# Patient Record
Sex: Male | Born: 1955 | Hispanic: No | Marital: Married | Smoking: Former smoker
Health system: Southern US, Community
[De-identification: ages and names within clinical notes are randomized; demographics above are authoritative.]

## PROBLEM LIST (undated history)

## (undated) DIAGNOSIS — G43109 Migraine with aura, not intractable, without status migrainosus: Secondary | ICD-10-CM

## (undated) DIAGNOSIS — E785 Hyperlipidemia, unspecified: Secondary | ICD-10-CM

## (undated) HISTORY — DX: Migraine with aura, not intractable, without status migrainosus: G43.109

## (undated) HISTORY — DX: Hyperlipidemia, unspecified: E78.5

---

## 1997-11-02 ENCOUNTER — Other Ambulatory Visit: Admission: RE | Admit: 1997-11-02 | Discharge: 1997-11-02 | Payer: Self-pay | Admitting: Family Medicine

## 1998-02-05 ENCOUNTER — Encounter: Admission: RE | Admit: 1998-02-05 | Discharge: 1998-05-06 | Payer: Self-pay

## 1998-06-30 ENCOUNTER — Encounter: Admission: RE | Admit: 1998-06-30 | Discharge: 1998-09-28 | Payer: Self-pay

## 1998-09-13 ENCOUNTER — Encounter: Admission: RE | Admit: 1998-09-13 | Discharge: 1998-12-12 | Payer: Self-pay

## 2009-03-01 ENCOUNTER — Encounter: Payer: Self-pay | Admitting: Gastroenterology

## 2009-04-26 ENCOUNTER — Ambulatory Visit: Payer: Self-pay | Admitting: Gastroenterology

## 2009-04-26 DIAGNOSIS — R195 Other fecal abnormalities: Secondary | ICD-10-CM | POA: Insufficient documentation

## 2009-04-28 ENCOUNTER — Ambulatory Visit: Payer: Self-pay | Admitting: Gastroenterology

## 2011-11-14 ENCOUNTER — Ambulatory Visit (INDEPENDENT_AMBULATORY_CARE_PROVIDER_SITE_OTHER): Payer: Commercial Managed Care - PPO | Admitting: Internal Medicine

## 2011-11-14 ENCOUNTER — Ambulatory Visit: Payer: Self-pay

## 2011-11-14 VITALS — BP 119/83 | HR 74 | Temp 97.4°F | Resp 18 | Ht 67.25 in | Wt 161.6 lb

## 2011-11-14 DIAGNOSIS — R51 Headache: Secondary | ICD-10-CM

## 2011-11-14 DIAGNOSIS — R079 Chest pain, unspecified: Secondary | ICD-10-CM

## 2011-11-14 DIAGNOSIS — R42 Dizziness and giddiness: Secondary | ICD-10-CM

## 2011-11-14 DIAGNOSIS — E785 Hyperlipidemia, unspecified: Secondary | ICD-10-CM

## 2011-11-14 DIAGNOSIS — R1110 Vomiting, unspecified: Secondary | ICD-10-CM

## 2011-11-14 DIAGNOSIS — F809 Developmental disorder of speech and language, unspecified: Secondary | ICD-10-CM

## 2011-11-14 LAB — COMPREHENSIVE METABOLIC PANEL
AST: 26 U/L (ref 0–37)
Albumin: 4.9 g/dL (ref 3.5–5.2)
BUN: 13 mg/dL (ref 6–23)
Calcium: 9.3 mg/dL (ref 8.4–10.5)
Chloride: 103 mEq/L (ref 96–112)
Potassium: 4.2 mEq/L (ref 3.5–5.3)

## 2011-11-14 LAB — POCT CBC
Granulocyte percent: 74.1 %G (ref 37–80)
HCT, POC: 54.8 % — AB (ref 43.5–53.7)
Hemoglobin: 17.9 g/dL (ref 14.1–18.1)
MCV: 86.6 fL (ref 80–97)
Platelet Count, POC: 192 10*3/uL (ref 142–424)
RBC: 6.33 M/uL — AB (ref 4.69–6.13)

## 2011-11-14 LAB — TSH: TSH: 0.543 u[IU]/mL (ref 0.350–4.500)

## 2011-11-14 LAB — LIPID PANEL: HDL: 49 mg/dL (ref >39)

## 2011-11-14 MED ORDER — ATORVASTATIN CALCIUM 10 MG PO TABS: 10.0000 mg | ORAL_TABLET | Freq: Every day | ORAL | Status: DC

## 2011-11-14 MED ORDER — PROMETHAZINE HCL 25 MG PO TABS: 25.0000 mg | ORAL_TABLET | Freq: Three times a day (TID) | ORAL | Status: DC | PRN

## 2011-11-14 MED ORDER — VERAPAMIL HCL 40 MG PO TABS: 40.0000 mg | ORAL_TABLET | Freq: Two times a day (BID) | ORAL | Status: DC
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
## 2011-11-15 LAB — PSA: PSA: 0.86 ng/mL (ref <=4.00)

## 2012-01-15 ENCOUNTER — Ambulatory Visit: Payer: Self-pay | Admitting: Internal Medicine

## 2014-10-28 ENCOUNTER — Encounter: Payer: Self-pay | Admitting: Gastroenterology

## 2015-07-12 ENCOUNTER — Ambulatory Visit (INDEPENDENT_AMBULATORY_CARE_PROVIDER_SITE_OTHER): Payer: BLUE CROSS/BLUE SHIELD

## 2015-07-12 ENCOUNTER — Ambulatory Visit (INDEPENDENT_AMBULATORY_CARE_PROVIDER_SITE_OTHER): Payer: BLUE CROSS/BLUE SHIELD | Admitting: Physician Assistant

## 2015-07-12 VITALS — BP 128/78 | HR 79 | Temp 98.5°F | Resp 16 | Ht 68.5 in | Wt 175.0 lb

## 2015-07-12 DIAGNOSIS — M545 Low back pain, unspecified: Secondary | ICD-10-CM

## 2015-07-12 LAB — GLUCOSE, POCT (MANUAL RESULT ENTRY): POC GLUCOSE: 77 mg/dL (ref 70–99)

## 2015-07-12 MED ORDER — PREDNISONE 20 MG PO TABS: ORAL_TABLET | ORAL | Status: DC
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
## 2015-07-12 NOTE — Patient Instructions (Signed)
Take prednisone in the morning - 3 tabs for 3 days, then 2 tabs for 3 days, then 1 tab for 3 days. Do not use any other products with this containing ibuprofen, naprosyn or aspirin. You MAY use tylenol with this. Heat, gentle massage and gentle stretching can help. Remain active, as inactivity can cause more pain. Don't do anything too strenuous though. If you develop new numbness, weakness or incontinence of urine or bowel, return to clinic or go to the emergency room ASAP. You will get a phone call to make appt with orthopedic specialist. Return as needed.

## 2015-09-14 ENCOUNTER — Ambulatory Visit: Payer: BLUE CROSS/BLUE SHIELD | Admitting: Pain Medicine

## 2016-10-05 IMAGING — CR DG LUMBAR SPINE COMPLETE 4+V
5 series · 5 of 5 positions shown · non-contrast
Comparison: None.

CLINICAL DATA: Lumbago

EXAM:
LUMBAR SPINE - COMPLETE 4+ VIEW

[AP (1 of 2)]
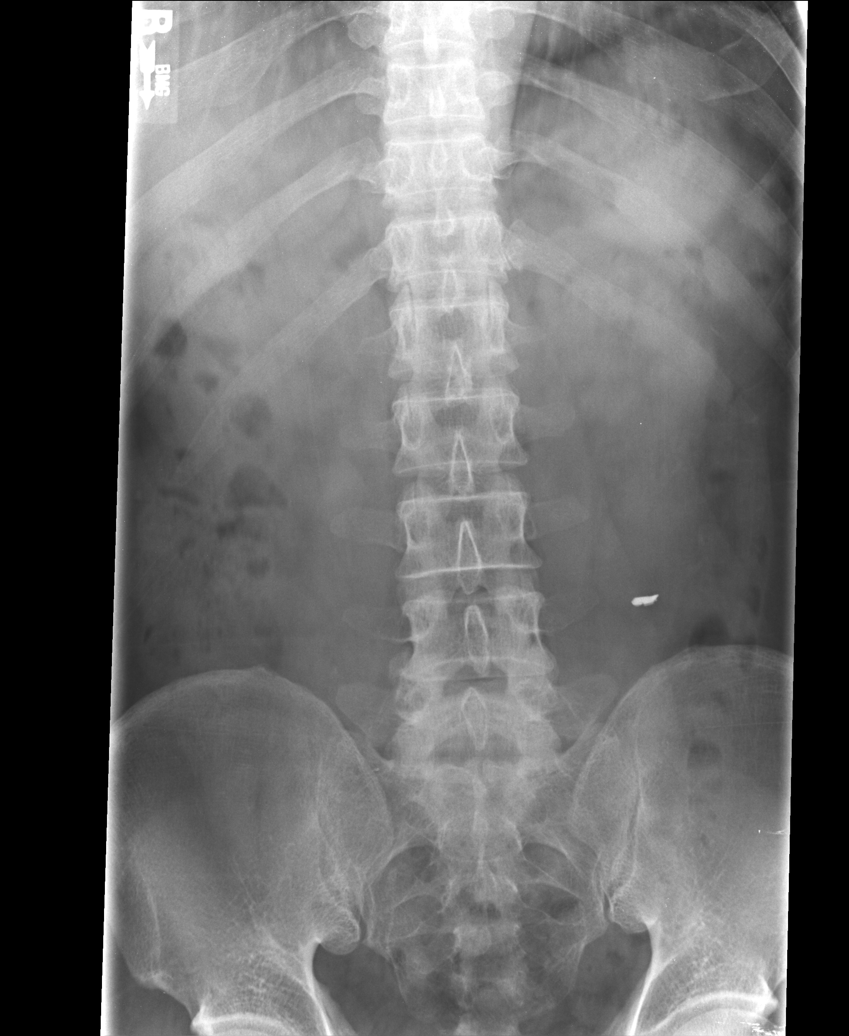

[AP (2 of 2)]
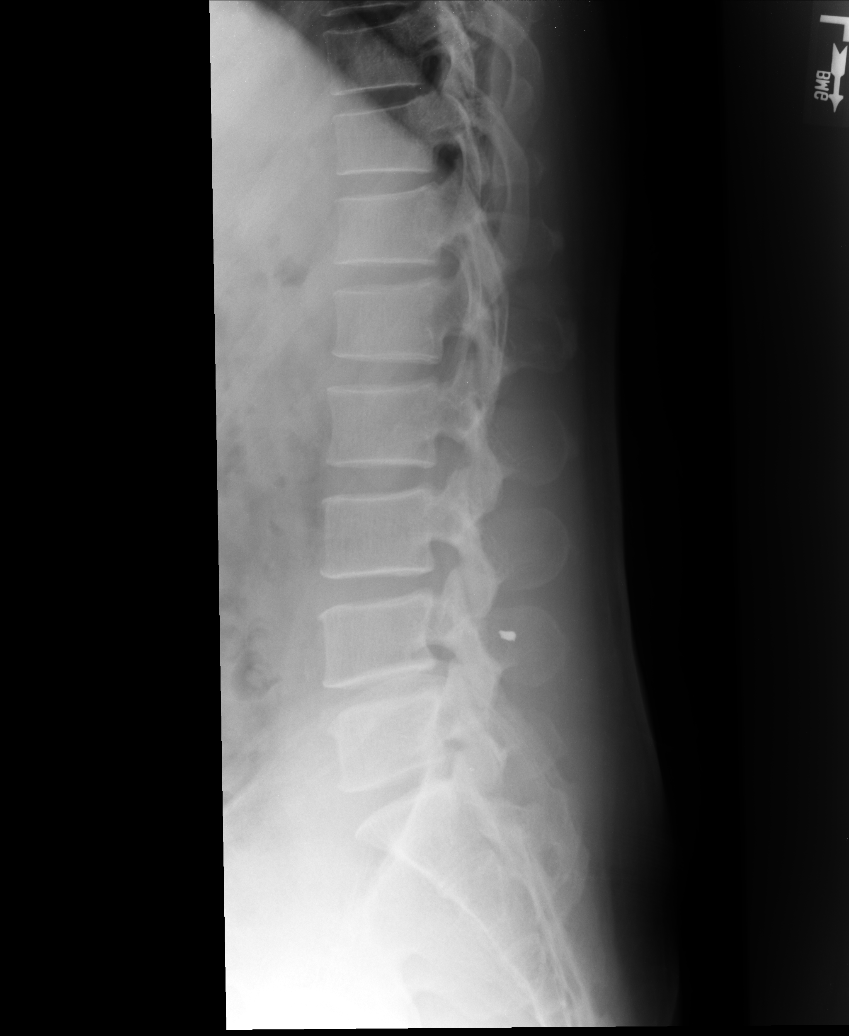

[l5 s1]
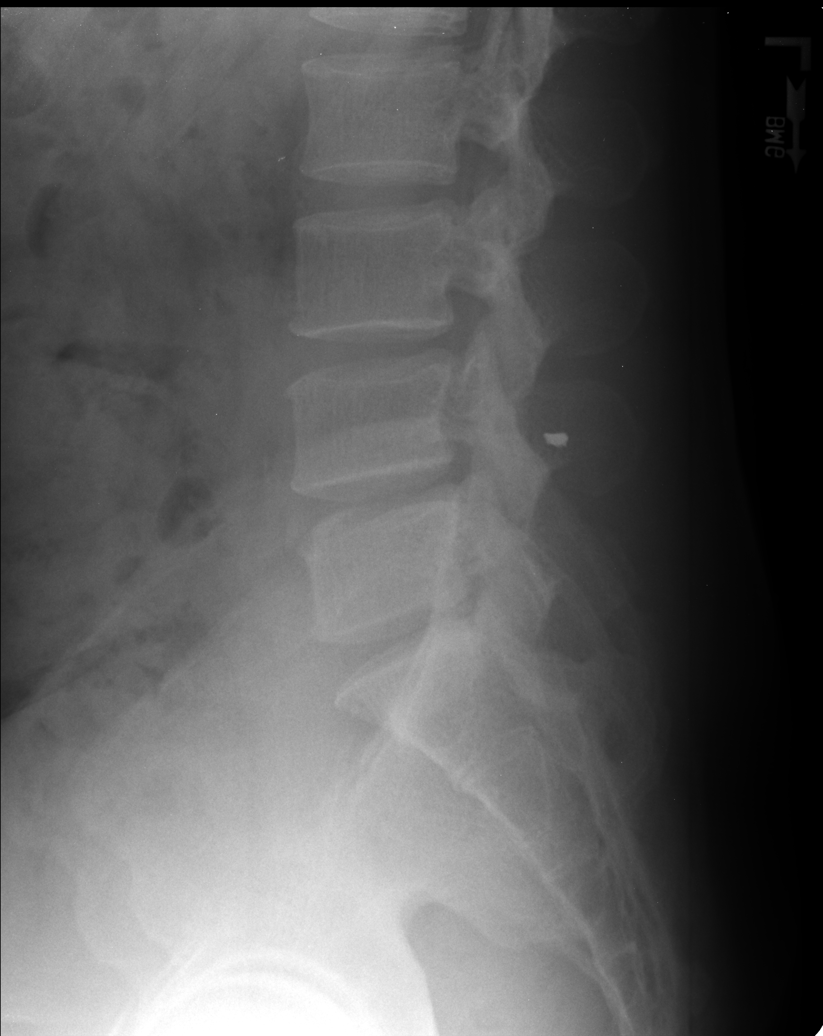

[rpo]
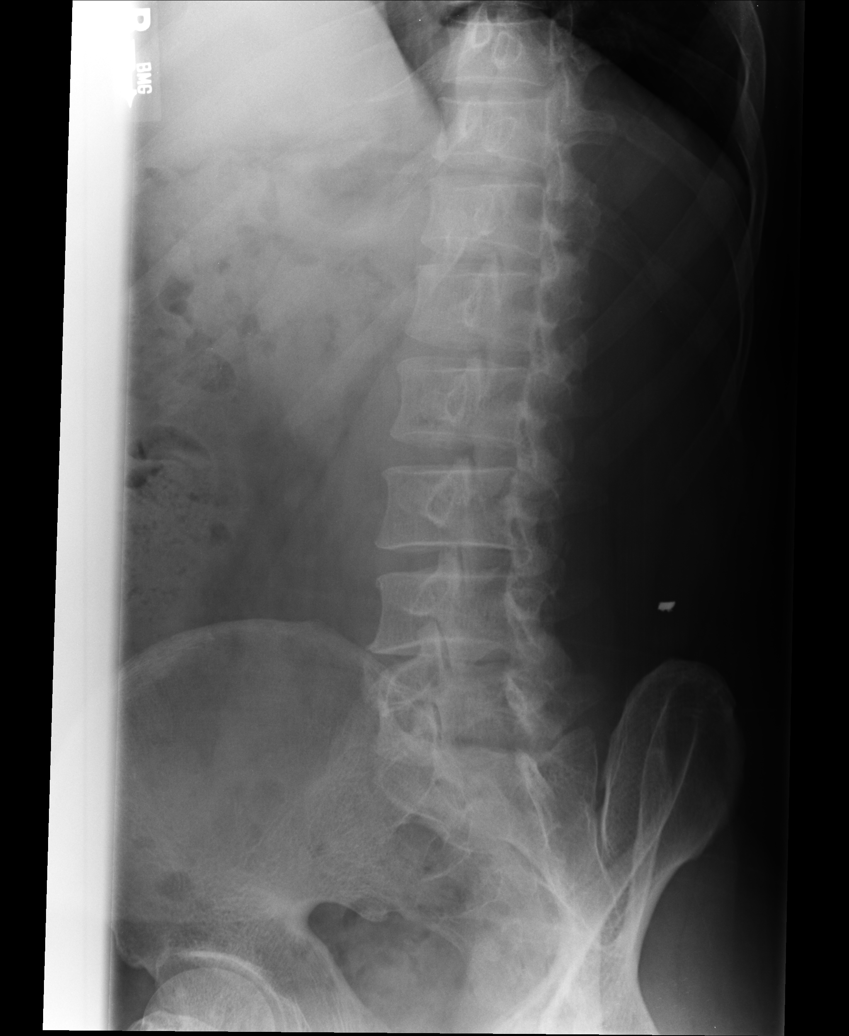

[lpo]
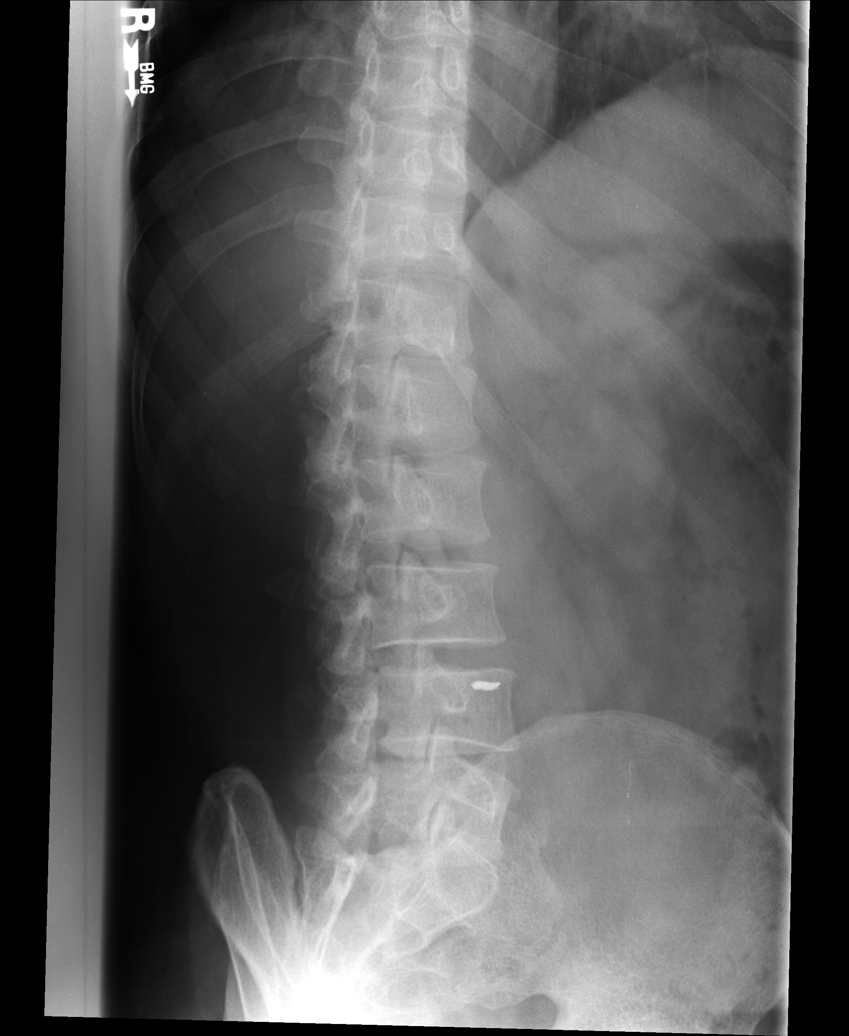

[5 of 5 positions shown; findings below may reference images not displayed]

FINDINGS: Frontal, lateral, spot lumbosacral lateral, and bilateral oblique
views were obtained. There are 5 non-rib-bearing lumbar type
vertebral bodies. There is no fracture or spondylolisthesis. There
is mild disc space narrowing at L4-5. Other disc spaces appear
normal. There is no appreciable facet osteoarthritic change. There
is a metallic foreign body in the soft tissues posterior and lateral
to L4.
IMPRESSION: Mild disc space narrowing L4-5. No fracture or spondylolisthesis.
Metallic foreign body in the soft tissues posterior and lateral to
the L4 vertebra.

## 2017-02-12 DIAGNOSIS — Z0271 Encounter for disability determination: Secondary | ICD-10-CM

## 2017-12-05 ENCOUNTER — Other Ambulatory Visit: Payer: Self-pay

## 2017-12-05 ENCOUNTER — Ambulatory Visit: Payer: BLUE CROSS/BLUE SHIELD | Admitting: Emergency Medicine

## 2017-12-05 ENCOUNTER — Encounter: Payer: Self-pay | Admitting: Emergency Medicine

## 2017-12-05 VITALS — BP 110/78 | HR 86 | Temp 98.2°F | Resp 16 | Ht 67.5 in | Wt 185.4 lb

## 2017-12-05 DIAGNOSIS — J22 Unspecified acute lower respiratory infection: Secondary | ICD-10-CM

## 2017-12-05 DIAGNOSIS — R0989 Other specified symptoms and signs involving the circulatory and respiratory systems: Secondary | ICD-10-CM

## 2017-12-05 DIAGNOSIS — R059 Cough, unspecified: Secondary | ICD-10-CM | POA: Insufficient documentation

## 2017-12-05 DIAGNOSIS — R05 Cough: Secondary | ICD-10-CM | POA: Diagnosis not present

## 2017-12-05 MED ORDER — AZITHROMYCIN 250 MG PO TABS: ORAL_TABLET | ORAL | 0 refills | Status: DC

## 2017-12-05 MED ORDER — PROMETHAZINE-CODEINE 6.25-10 MG/5ML PO SYRP: 5.0000 mL | ORAL_SOLUTION | Freq: Every evening | ORAL | 0 refills | Status: DC | PRN

## 2017-12-05 MED ORDER — PSEUDOEPHEDRINE-GUAIFENESIN ER 60-600 MG PO TB12
1.0000 | ORAL_TABLET | Freq: Two times a day (BID) | ORAL | 1 refills | Status: AC
Start: 2017-12-05 — End: 2017-12-10

## 2017-12-05 NOTE — Progress Notes (Signed)
Austin Cunningham 62 y.o.   Chief Complaint  Patient presents with  . Cough    x 2 days    HISTORY OF PRESENT ILLNESS: This is a 62 y.o. male complaining of cough and congestion for the past 2 days.  Cannot sleep at night.  Some fever but no chills.  Eating and drinking well.  Denies nausea or vomiting.  No other significant symptoms.  Has been taking Mucinex sinus with no relief.  HPI   Prior to Admission medications   Medication Sig Start Date End Date Taking? Authorizing Provider  lisinopril-hydrochlorothiazide (PRINZIDE,ZESTORETIC) 20-12.5 MG tablet Take 1 tablet by mouth daily.   Yes [provider]  simvastatin (ZOCOR) 20 MG tablet Take 20 mg by mouth daily.   Yes [provider]  atorvastatin (LIPITOR) 10 MG tablet Take 1 tablet (10 mg total) by mouth daily. 11/14/11 11/13/12  Jonita AlbeeGuest, Chris W, MD  naproxen (NAPRELAN) 500 MG 24 hr tablet Take 500 mg by mouth daily with breakfast.    [provider]  predniSONE (DELTASONE) 20 MG tablet Take 3 PO QAM x3days, 2 PO QAM x3days, 1 PO QAM x3days Patient not taking: Reported on 12/05/2017 07/12/15   Dorna LeitzBush, Nicole V, PA-C  verapamil (CALAN) 40 MG tablet Take 1 tablet (40 mg total) by mouth 2 (two) times daily before lunch and supper. 11/14/11 11/13/12  Jonita AlbeeGuest, Chris W, MD    No Known Allergies  Patient Active Problem List   Diagnosis Date Noted  . NONSPECIFIC ABNORMAL FINDING IN STOOL CONTENTS 04/26/2009    Past Medical History:  Diagnosis Date  . Hyperlipidemia   . Migraine headache with aura     No past surgical history on file.  Social History   Socioeconomic History  . Marital status: Married    Spouse name: Not on file  . Number of children: Not on file  . Years of education: Not on file  . Highest education level: Not on file  Occupational History  . Not on file  Social Needs  . Financial resource strain: Not on file  . Food insecurity:    Worry: Not on file    Inability: Not on file  .  Transportation needs:    Medical: Not on file    Non-medical: Not on file  Tobacco Use  . Smoking status: Former Smoker    Packs/day: 1.00    Years: 40.00    Pack years: 40.00    Types: Cigarettes    Last attempt to quit: 11/13/2008    Years since quitting: 9.0  . Smokeless tobacco: Never Used  Substance and Sexual Activity  . Alcohol use: Not on file  . Drug use: Not on file  . Sexual activity: Not on file  Lifestyle  . Physical activity:    Days per week: Not on file    Minutes per session: Not on file  . Stress: Not on file  Relationships  . Social connections:    Talks on phone: Not on file    Gets together: Not on file    Attends religious service: Not on file    Active member of club or organization: Not on file    Attends meetings of clubs or organizations: Not on file    Relationship status: Not on file  . Intimate partner violence:    Fear of current or ex partner: Not on file    Emotionally abused: Not on file    Physically abused: Not on file  Forced sexual activity: Not on file  Other Topics Concern  . Not on file  Social History Narrative  . Not on file    No family history on file.   Review of Systems  Constitutional: Positive for fever and malaise/fatigue. Negative for chills.  HENT: Positive for congestion. Negative for ear pain and sore throat.   Eyes: Negative.   Respiratory: Positive for cough. Negative for hemoptysis, shortness of breath and wheezing.   Cardiovascular: Negative.  Negative for chest pain and palpitations.  Gastrointestinal: Negative.  Negative for abdominal pain, diarrhea, heartburn, nausea and vomiting.  Skin: Negative for rash.  Neurological: Negative.  Negative for dizziness and headaches.  Endo/Heme/Allergies: Negative.   All other systems reviewed and are negative.   Vitals:   12/05/17 1211  BP: 110/78  Pulse: 86  Resp: 16  Temp: 98.2 F (36.8 C)  SpO2: 97%    Physical Exam  Constitutional: He is oriented to  person, place, and time. He appears well-developed and well-nourished.  HENT:  Head: Normocephalic and atraumatic.  Nose: Nose normal.  Mouth/Throat: Oropharynx is clear and moist.  Eyes: Pupils are equal, round, and reactive to light. Conjunctivae and EOM are normal.  Neck: Normal range of motion. Neck supple.  Cardiovascular: Normal rate, regular rhythm and normal heart sounds.  Pulmonary/Chest: Effort normal and breath sounds normal.  Abdominal: Soft. Bowel sounds are normal. He exhibits no distension. There is no tenderness.  Musculoskeletal: Normal range of motion.  Lymphadenopathy:    He has no cervical adenopathy.  Neurological: He is alert and oriented to person, place, and time. No sensory deficit. He exhibits normal muscle tone.  Skin: Skin is warm and dry. Capillary refill takes less than 2 seconds.  Psychiatric: He has a normal mood and affect. His behavior is normal.  Vitals reviewed.  A total of 25 minutes was spent in the room with the patient, greater than 50% of which was in counseling/coordination of care regarding differential diagnosis, treatment, medications, need for follow-up if no better or worse.   ASSESSMENT & PLAN: [name] was seen today for cough.  Diagnoses and all orders for this visit:  Cough -     promethazine-codeine (PHENERGAN WITH CODEINE) 6.25-10 MG/5ML syrup; Take 5 mLs by mouth at bedtime as needed for cough.  Lower resp. tract infection -     azithromycin (ZITHROMAX) 250 MG tablet; Sig as indicated  Chest congestion -     pseudoephedrine-guaifenesin (MUCINEX D) 60-600 MG 12 hr tablet; Take 1 tablet by mouth every 12 (twelve) hours for 5 days.    Patient Instructions       IF you received an x-ray today, you will receive an invoice from [facility]. Please contact [facility] at [phone] with questions or concerns regarding your invoice.   IF you received labwork today, you will receive an invoice from [facility]. Please  contact [facility] at [phone] with questions or concerns regarding your invoice.   Our billing staff will not be able to assist you with questions regarding bills from these companies.  You will be contacted with the lab results as soon as they are available. The fastest way to get your results is to activate your My Chart account. Instructions are located on the last page of this paperwork. If you have not heard from [place] regarding the results in 2 weeks, please contact this office.     Cough, Adult A cough helps to clear your throat and lungs. A cough may last only  2-3 weeks (acute), or it may last longer than 8 weeks (chronic). Many different things can cause a cough. A cough may be a sign of an illness or another medical condition. Follow these instructions at home:  Pay attention to any changes in your cough.  Take medicines only as told by your doctor. ? If you were prescribed an antibiotic medicine, take it as told by your doctor. Do not stop taking it even if you start to feel better. ? Talk with your doctor before you try using a cough medicine.  Drink enough fluid to keep your pee (urine) clear or pale yellow.  If the air is dry, use a cold steam vaporizer or humidifier in your home.  Stay away from things that make you cough at work or at home.  If your cough is worse at night, try using extra pillows to raise your head up higher while you sleep.  Do not smoke, and try not to be around smoke. If you need help quitting, ask your doctor.  Do not have caffeine.  Do not drink alcohol.  Rest as needed. Contact a doctor if:  You have new problems (symptoms).  You cough up yellow fluid (pus).  Your cough does not get better after 2-3 weeks, or your cough gets worse.  Medicine does not help your cough and you are not sleeping well.  You have pain that gets worse or pain that is not helped with medicine.  You have a fever.  You are losing weight and you do not know  why.  You have night sweats. Get help right away if:  You cough up blood.  You have trouble breathing.  Your heartbeat is very fast. This information is not intended to replace advice given to you by your health care provider. Make sure you discuss any questions you have with your health care provider. Document Released: 02/16/2011 Document Revised: 11/11/2015 Document Reviewed: 08/12/2014 Elsevier Interactive Patient Education  2018 Elsevier Inc.      Edwina Barth, MD [facility]

## 2017-12-05 NOTE — Patient Instructions (Addendum)
     IF you received an x-ray today, you will receive an invoice from [facility]. Please contact [facility] at [phone] with questions or concerns regarding your invoice.   IF you received labwork today, you will receive an invoice from [facility]. Please contact [facility] at [phone] with questions or concerns regarding your invoice.   Our billing staff will not be able to assist you with questions regarding bills from these companies.  You will be contacted with the lab results as soon as they are available. The fastest way to get your results is to activate your My Chart account. Instructions are located on the last page of this paperwork. If you have not heard from us regarding the results in 2 weeks, please contact this office.     Cough, Adult A cough helps to clear your throat and lungs. A cough may last only 2-3 weeks (acute), or it may last longer than 8 weeks (chronic). Many different things can cause a cough. A cough may be a sign of an illness or another medical condition. Follow these instructions at home:  Pay attention to any changes in your cough.  Take medicines only as told by your doctor. ? If you were prescribed an antibiotic medicine, take it as told by your doctor. Do not stop taking it even if you start to feel better. ? Talk with your doctor before you try using a cough medicine.  Drink enough fluid to keep your pee (urine) clear or pale yellow.  If the air is dry, use a cold steam vaporizer or humidifier in your home.  Stay away from things that make you cough at work or at home.  If your cough is worse at night, try using extra pillows to raise your head up higher while you sleep.  Do not smoke, and try not to be around smoke. If you need help quitting, ask your doctor.  Do not have caffeine.  Do not drink alcohol.  Rest as needed. Contact a doctor if:  You have new problems (symptoms).  You cough up yellow fluid  (pus).  Your cough does not get better after 2-3 weeks, or your cough gets worse.  Medicine does not help your cough and you are not sleeping well.  You have pain that gets worse or pain that is not helped with medicine.  You have a fever.  You are losing weight and you do not know why.  You have night sweats. Get help right away if:  You cough up blood.  You have trouble breathing.  Your heartbeat is very fast. This information is not intended to replace advice given to you by your health care provider. Make sure you discuss any questions you have with your health care provider. Document Released: 02/16/2011 Document Revised: 11/11/2015 Document Reviewed: 08/12/2014 Elsevier Interactive Patient Education  2018 Elsevier Inc.  

## 2019-04-04 ENCOUNTER — Encounter: Payer: Self-pay | Admitting: Gastroenterology

## 2023-02-08 ENCOUNTER — Encounter (HOSPITAL_COMMUNITY): Payer: Self-pay

## 2023-02-08 ENCOUNTER — Inpatient Hospital Stay (HOSPITAL_COMMUNITY)
Admission: EM | Admit: 2023-02-08 | Discharge: 2023-02-12 | DRG: 638 | Disposition: A | Discharge status: Home / Self Care | Payer: Medicare HMO | Attending: Family Medicine | Admitting: Family Medicine

## 2023-02-08 ENCOUNTER — Ambulatory Visit
Admission: EM | Admit: 2023-02-08 | Discharge: 2023-02-08 | Disposition: A | Discharge status: Home / Self Care | Payer: Medicare HMO | Attending: Physician Assistant | Admitting: Physician Assistant

## 2023-02-08 ENCOUNTER — Other Ambulatory Visit: Payer: Self-pay

## 2023-02-08 ENCOUNTER — Encounter: Payer: Self-pay | Admitting: Physician Assistant

## 2023-02-08 ENCOUNTER — Emergency Department (HOSPITAL_COMMUNITY): Payer: Medicare HMO

## 2023-02-08 DIAGNOSIS — Z23 Encounter for immunization: Secondary | ICD-10-CM

## 2023-02-08 DIAGNOSIS — E1165 Type 2 diabetes mellitus with hyperglycemia: Principal | ICD-10-CM | POA: Diagnosis present

## 2023-02-08 DIAGNOSIS — D751 Secondary polycythemia: Secondary | ICD-10-CM | POA: Diagnosis present

## 2023-02-08 DIAGNOSIS — Z1152 Encounter for screening for COVID-19: Secondary | ICD-10-CM

## 2023-02-08 DIAGNOSIS — B370 Candidal stomatitis: Secondary | ICD-10-CM | POA: Diagnosis not present

## 2023-02-08 DIAGNOSIS — B379 Candidiasis, unspecified: Secondary | ICD-10-CM | POA: Diagnosis not present

## 2023-02-08 DIAGNOSIS — R739 Hyperglycemia, unspecified: Secondary | ICD-10-CM

## 2023-02-08 DIAGNOSIS — E785 Hyperlipidemia, unspecified: Secondary | ICD-10-CM | POA: Diagnosis present

## 2023-02-08 DIAGNOSIS — Z79899 Other long term (current) drug therapy: Secondary | ICD-10-CM

## 2023-02-08 DIAGNOSIS — B3789 Other sites of candidiasis: Secondary | ICD-10-CM | POA: Diagnosis present

## 2023-02-08 DIAGNOSIS — E860 Dehydration: Secondary | ICD-10-CM | POA: Diagnosis present

## 2023-02-08 DIAGNOSIS — Z7984 Long term (current) use of oral hypoglycemic drugs: Secondary | ICD-10-CM

## 2023-02-08 DIAGNOSIS — Z87891 Personal history of nicotine dependence: Secondary | ICD-10-CM

## 2023-02-08 DIAGNOSIS — I10 Essential (primary) hypertension: Secondary | ICD-10-CM | POA: Diagnosis present

## 2023-02-08 LAB — BLOOD GAS, VENOUS
Acid-base deficit: 3.8 mmol/L — ABNORMAL HIGH (ref 0.0–2.0)
Bicarbonate: 20.7 mmol/L (ref 20.0–28.0)
O2 Saturation: 97.9 %
Patient temperature: 37
pCO2, Ven: 35 mmHg — ABNORMAL LOW (ref 44–60)
pH, Ven: 7.38 (ref 7.25–7.43)
pO2, Ven: 76 mmHg — ABNORMAL HIGH (ref 32–45)

## 2023-02-08 LAB — POCT URINALYSIS DIP (MANUAL ENTRY)
Bilirubin, UA: NEGATIVE
Glucose, UA: 500 mg/dL — AB
Leukocytes, UA: NEGATIVE
Nitrite, UA: NEGATIVE
Protein Ur, POC: NEGATIVE mg/dL
Spec Grav, UA: 1.01 (ref 1.010–1.025)
Urobilinogen, UA: 0.2 E.U./dL
pH, UA: 5.5 (ref 5.0–8.0)

## 2023-02-08 LAB — CBG MONITORING, ED: Glucose-Capillary: 492 mg/dL — ABNORMAL HIGH (ref 70–99)

## 2023-02-08 LAB — COMPREHENSIVE METABOLIC PANEL
ALT: 17 U/L (ref 0–44)
AST: 22 U/L (ref 15–41)
Albumin: 3.3 g/dL — ABNORMAL LOW (ref 3.5–5.0)
Alkaline Phosphatase: 64 U/L (ref 38–126)
Anion gap: 11 (ref 5–15)
BUN: 19 mg/dL (ref 8–23)
CO2: 20 mmol/L — ABNORMAL LOW (ref 22–32)
Calcium: 8.1 mg/dL — ABNORMAL LOW (ref 8.9–10.3)
Chloride: 103 mmol/L (ref 98–111)
Creatinine, Ser: 1.05 mg/dL (ref 0.61–1.24)
GFR, Estimated: >60 mL/min (ref >60)
Glucose, Bld: 355 mg/dL — ABNORMAL HIGH (ref 70–99)
Potassium: 4.9 mmol/L (ref 3.5–5.1)
Sodium: 134 mmol/L — ABNORMAL LOW (ref 135–145)
Total Bilirubin: 1.7 mg/dL — ABNORMAL HIGH (ref 0.3–1.2)
Total Protein: 5.9 g/dL — ABNORMAL LOW (ref 6.5–8.1)

## 2023-02-08 LAB — CBC WITH DIFFERENTIAL/PLATELET
Abs Immature Granulocytes: 0.03 10*3/uL (ref 0.00–0.07)
Basophils Absolute: 0 10*3/uL (ref 0.0–0.1)
Basophils Relative: 0 %
Eosinophils Absolute: 0 10*3/uL (ref 0.0–0.5)
Eosinophils Relative: 0 %
HCT: 53.3 % — ABNORMAL HIGH (ref 39.0–52.0)
Hemoglobin: 17.6 g/dL — ABNORMAL HIGH (ref 13.0–17.0)
Immature Granulocytes: 0 %
Lymphocytes Relative: 34 %
Lymphs Abs: 3.3 10*3/uL (ref 0.7–4.0)
MCH: 28 pg (ref 26.0–34.0)
MCHC: 33 g/dL (ref 30.0–36.0)
MCV: 84.9 fL (ref 80.0–100.0)
Monocytes Absolute: 0.6 10*3/uL (ref 0.1–1.0)
Monocytes Relative: 6 %
Neutro Abs: 5.8 10*3/uL (ref 1.7–7.7)
Neutrophils Relative %: 60 %
Platelets: 131 10*3/uL — ABNORMAL LOW (ref 150–400)
RBC: 6.28 MIL/uL — ABNORMAL HIGH (ref 4.22–5.81)
RDW: 12.9 % (ref 11.5–15.5)
WBC: 9.9 10*3/uL (ref 4.0–10.5)
nRBC: 0 % (ref 0.0–0.2)

## 2023-02-08 LAB — POCT FASTING CBG KUC MANUAL ENTRY

## 2023-02-08 LAB — URINALYSIS, ROUTINE W REFLEX MICROSCOPIC
Bacteria, UA: NONE SEEN
Bilirubin Urine: NEGATIVE
Glucose, UA: >=500 mg/dL — AB
Hgb urine dipstick: NEGATIVE
Ketones, ur: 80 mg/dL — AB
Leukocytes,Ua: NEGATIVE
Nitrite: NEGATIVE
Protein, ur: NEGATIVE mg/dL
Specific Gravity, Urine: 1.033 — ABNORMAL HIGH (ref 1.005–1.030)
pH: 5 (ref 5.0–8.0)

## 2023-02-08 LAB — LIPASE, BLOOD: Lipase: 38 U/L (ref 11–51)

## 2023-02-08 LAB — TROPONIN I (HIGH SENSITIVITY)
Troponin I (High Sensitivity): 8 ng/L (ref <18)
Troponin I (High Sensitivity): 8 ng/L (ref <18)

## 2023-02-08 LAB — BETA-HYDROXYBUTYRIC ACID: Beta-Hydroxybutyric Acid: 4.28 mmol/L — ABNORMAL HIGH (ref 0.05–0.27)

## 2023-02-08 LAB — GROUP A STREP BY PCR: Group A Strep by PCR: NOT DETECTED

## 2023-02-08 LAB — SARS CORONAVIRUS 2 BY RT PCR: SARS Coronavirus 2 by RT PCR: NEGATIVE

## 2023-02-08 LAB — GLUCOSE, CAPILLARY: Glucose-Capillary: 308 mg/dL — ABNORMAL HIGH (ref 70–99)

## 2023-02-08 MED ORDER — ONDANSETRON HCL 4 MG/2ML IJ SOLN: 4.0000 mg | Freq: Four times a day (QID) | INTRAMUSCULAR | Status: DC | PRN

## 2023-02-08 MED ORDER — SODIUM CHLORIDE 0.9 % IV BOLUS
1000.0000 mL | Freq: Once | INTRAVENOUS | Status: AC
  Administered 2023-02-08: 1000 mL via INTRAVENOUS

## 2023-02-08 MED ORDER — TRAZODONE HCL 50 MG PO TABS
25.0000 mg | ORAL_TABLET | Freq: Every evening | ORAL | Status: DC | PRN
  Administered 2023-02-08 – 2023-02-11 (×4): 25 mg via ORAL
  Filled 2023-02-08 (×4): qty 1

## 2023-02-08 MED ORDER — CLOTRIMAZOLE 10 MG MT TROC: 10.0000 mg | Freq: Every day | OROMUCOSAL | Status: DC

## 2023-02-08 MED ORDER — INSULIN ASPART 100 UNIT/ML IJ SOLN
0.0000 [IU] | Freq: Three times a day (TID) | INTRAMUSCULAR | Status: DC
  Administered 2023-02-09 – 2023-02-10 (×5): 5 [IU] via SUBCUTANEOUS
  Administered 2023-02-10: 8 [IU] via SUBCUTANEOUS
  Administered 2023-02-11: 5 [IU] via SUBCUTANEOUS
  Administered 2023-02-11: 3 [IU] via SUBCUTANEOUS
  Administered 2023-02-12: 2 [IU] via SUBCUTANEOUS
  Filled 2023-02-08: qty 0.15

## 2023-02-08 MED ORDER — FLUCONAZOLE 200 MG PO TABS
200.0000 mg | ORAL_TABLET | Freq: Once | ORAL | Status: AC
  Administered 2023-02-08: 200 mg via ORAL
  Filled 2023-02-08: qty 1

## 2023-02-08 MED ORDER — ENOXAPARIN SODIUM 40 MG/0.4ML IJ SOSY
40.0000 mg | PREFILLED_SYRINGE | INTRAMUSCULAR | Status: DC
  Administered 2023-02-08 – 2023-02-11 (×4): 40 mg via SUBCUTANEOUS
  Filled 2023-02-08 (×4): qty 0.4

## 2023-02-08 MED ORDER — LOSARTAN POTASSIUM 50 MG PO TABS
50.0000 mg | ORAL_TABLET | Freq: Every day | ORAL | Status: DC
  Administered 2023-02-08 – 2023-02-11 (×4): 50 mg via ORAL
  Filled 2023-02-08 (×4): qty 1

## 2023-02-08 MED ORDER — ACETAMINOPHEN 650 MG RE SUPP: 650.0000 mg | Freq: Four times a day (QID) | RECTAL | Status: DC | PRN

## 2023-02-08 MED ORDER — FLUCONAZOLE 100 MG PO TABS
100.0000 mg | ORAL_TABLET | Freq: Every day | ORAL | Status: DC
  Administered 2023-02-09 – 2023-02-12 (×4): 100 mg via ORAL
  Filled 2023-02-08 (×4): qty 1

## 2023-02-08 MED ORDER — INSULIN ASPART 100 UNIT/ML IJ SOLN
0.0000 [IU] | Freq: Every day | INTRAMUSCULAR | Status: DC
  Administered 2023-02-08: 4 [IU] via SUBCUTANEOUS
  Administered 2023-02-09: 5 [IU] via SUBCUTANEOUS
  Administered 2023-02-11: 2 [IU] via SUBCUTANEOUS
  Filled 2023-02-08: qty 0.05

## 2023-02-08 MED ORDER — ACETAMINOPHEN 325 MG PO TABS
650.0000 mg | ORAL_TABLET | Freq: Four times a day (QID) | ORAL | Status: DC | PRN
  Administered 2023-02-10 – 2023-02-11 (×3): 650 mg via ORAL
  Filled 2023-02-08 (×3): qty 2

## 2023-02-08 MED ORDER — ONDANSETRON HCL 4 MG PO TABS: 4.0000 mg | ORAL_TABLET | Freq: Four times a day (QID) | ORAL | Status: DC | PRN

## 2023-02-08 MED ORDER — ALBUTEROL SULFATE (2.5 MG/3ML) 0.083% IN NEBU: 2.5000 mg | INHALATION_SOLUTION | RESPIRATORY_TRACT | Status: DC | PRN

## 2023-02-08 MED ORDER — SODIUM CHLORIDE 0.9 % IV SOLN: INTRAVENOUS | Status: DC

## 2023-02-08 MED ORDER — SIMVASTATIN 20 MG PO TABS
20.0000 mg | ORAL_TABLET | Freq: Every day | ORAL | Status: DC
  Administered 2023-02-09 – 2023-02-12 (×4): 20 mg via ORAL
  Filled 2023-02-08 (×4): qty 1

## 2023-02-08 NOTE — ED Notes (Signed)
Lab called to add on Beta-hydroxy.

## 2023-02-08 NOTE — H&P (Signed)
History and Physical  CARLOS UPADHYAYA ZOX:[id number] DOB: 10-Mar-1956 DOA: 02/08/2023  PCP: Patient, No Pcp Per   Chief Complaint: Bitter taste  HPI: Austin Cunningham is a 67 y.o. male with medical history significant for hypertension, hyperlipidemia being admitted to the hospital with oropharyngeal candidiasis and hyperglycemia likely new onset diabetes.  History is collected with the assistance of his daughter who is at the bedside and request to translate.  Patient speaks some [place], but daughter recommends using translator when available.  Indicates, for the last week or so he has had a sore throat, bothers him the most in the morning.  Just feels like the back of his throat like he has strep throat or something.  He has also been having some polydipsia and polyuria for the last couple of weeks.  Denies any dysuria, or diarrhea.  Denies any fevers or chills, had some nausea with vomiting a couple times last week but this has subsided.  Went to urgent care today, was found to have blood sugar over 600 and brought to the ER for evaluation.  As noted below, initial blood sugar 492, he was given IV fluids.  Also has evidence of oropharyngeal candidiasis on exam.  Hospitalist was contacted for admission due to new onset diabetes, and potential for worsening hyperglycemia.  Review of Systems: Please see HPI for pertinent positives and negatives. A complete 10 system review of systems are otherwise negative.  Past Medical History:  Diagnosis Date   Hyperlipidemia    Migraine headache with aura    History reviewed. No pertinent surgical history.  Social History:  reports that he quit smoking about 14 years ago. His smoking use included cigarettes. He started smoking about 54 years ago. He has a 40 pack-year smoking history. He has never used smokeless tobacco. He reports that he does not currently use alcohol. He reports that he does not use drugs.   No Known Allergies  History reviewed. No pertinent family  history.   Prior to Admission medications   Medication Sig Start Date End Date Taking? Authorizing Provider  losartan (COZAAR) 50 MG tablet Take 50 mg by mouth daily.   Yes [provider]  simvastatin (ZOCOR) 20 MG tablet Take 20 mg by mouth daily.   Yes [provider]  atorvastatin (LIPITOR) 10 MG tablet Take 1 tablet (10 mg total) by mouth daily. Patient not taking: Reported on 02/08/2023 11/14/11 11/13/12  Jonita Albee, MD  azithromycin [facility]) 250 MG tablet Sig as indicated Patient not taking: Reported on 02/08/2023 12/05/17   Georgina Quint, MD  predniSONE (DELTASONE) 20 MG tablet Take 3 PO QAM x3days, 2 PO QAM x3days, 1 PO QAM x3days Patient not taking: Reported on 12/05/2017 07/12/15   Dorna Leitz, PA-C  promethazine-codeine (PHENERGAN WITH CODEINE) 6.25-10 MG/5ML syrup Take 5 mLs by mouth at bedtime as needed for cough. Patient not taking: Reported on 02/08/2023 12/05/17   Georgina Quint, MD  verapamil (CALAN) 40 MG tablet Take 1 tablet (40 mg total) by mouth 2 (two) times daily before lunch and supper. Patient not taking: Reported on 02/08/2023 11/14/11 11/13/12  Jonita Albee, MD    Physical Exam: BP (!) 131/90   Pulse 88   Temp 98.4 F (36.9 C) (Oral)   Resp 18   Ht 5' 7.5" (1.715 m)   Wt 84.1 kg   SpO2 96%   BMI 28.61 kg/m   General:  Alert, oriented, calm, in no acute distress  Eyes:  EOMI, clear conjuctivae, white sclerea Neck: supple, no masses, trachea mildline  Mouth: white plaque area oropharynx, without associated erythema or edema Cardiovascular: RRR, no murmurs or rubs, no peripheral edema  Respiratory: clear to auscultation bilaterally, no wheezes, no crackles  Abdomen: soft, nontender, nondistended, normal bowel tones heard  Skin: dry, no rashes  Musculoskeletal: no joint effusions, normal range of motion  Psychiatric: appropriate affect, normal speech  Neurologic: extraocular muscles intact, clear speech, moving all  extremities with intact sensorium          Labs on Admission:  Basic Metabolic Panel: Recent Labs  Lab 02/08/23 1717  NA 134*  K 4.9  CL 103  CO2 20*  GLUCOSE 355*  BUN 19  CREATININE 1.05  CALCIUM 8.1*   Liver Function Tests: Recent Labs  Lab 02/08/23 1717  AST 22  ALT 17  ALKPHOS 64  BILITOT 1.7*  PROT 5.9*  ALBUMIN 3.3*   Recent Labs  Lab 02/08/23 1717  LIPASE 38   No results for input(s): "AMMONIA" in the last 168 hours. CBC: Recent Labs  Lab 02/08/23 1609  WBC 9.9  NEUTROABS 5.8  HGB 17.6*  HCT 53.3*  MCV 84.9  PLT 131*   Cardiac Enzymes: No results for input(s): "CKTOTAL", "CKMB", "CKMBINDEX", "TROPONINI" in the last 168 hours.  BNP (last 3 results) No results for input(s): "BNP" in the last 8760 hours.  ProBNP (last 3 results) No results for input(s): "PROBNP" in the last 8760 hours.  CBG: Recent Labs  Lab 02/08/23 1436  GLUCAP 492*    Radiological Exams on Admission: DG Chest Port 1 View  Result Date: 02/08/2023 CLINICAL DATA:  Right chest pain EXAM: PORTABLE CHEST 1 VIEW COMPARISON:  Chest x-ray 11/06/2011 FINDINGS: The heart size and mediastinal contours are within normal limits. Both lungs are clear. The visualized skeletal structures are unremarkable. IMPRESSION: No active disease. Electronically Signed   By: Darliss Cheney M.D.   On: 02/08/2023 18:34    Assessment/Plan This is a pleasant 67 year old gentleman with history of hypertension, hyperlipidemia being admitted to the hospital with oropharyngeal candidiasis and hyperglycemia new onset diabetes.  New onset diabetes-suspected due to significant hyperglycemia, without evidence of beta ketoacidosis.  He does have ketones in his urine, metabolic acidosis, but no anion gap.  Blood sugar is already improving with hydration. -Observation admission -Continue IV fluids -Carb controlled diet -Sliding scale insulin  Oropharyngeal candidiasis-Diflucan p.o.  Pseudohyponatremia-due to  hyperglycemia, corrects to normal  Polycythemia-likely due to clinical dehydration, expect this to improve with hydration  Hypertension-losartan  Hyperlipidemia-Zocor  DVT prophylaxis: Lovenox     Code Status: Full Code  Consults called: None  Admission status: Observation  Time spent: 46 minutes  Islay Polanco Sharlette Dense MD [facility] Pager [phone]  If 7PM-7AM, please contact night-coverage [email or web address] Password [facility]  02/08/2023, 7:05 PM

## 2023-02-08 NOTE — ED Provider Notes (Signed)
Patient with no prior history of diabetes who presents here today sent in by urgent care for a glucose of 600 mg/dL.  Daughter helps with translation at bedside.  Patient states that he is had significant urination and thirst over the last week or 2.  He is also had some nausea and minimal vomiting.  Denies any chest pain, shortness of breath, abdominal pain, dysuria or hematuria, flank pain.  No history of similar symptoms.  He presented to urgent care for bilateral knee pain nontraumatic as well as a sore throat and was sent to the ED.  Physical exam on arrival demonstrates heart regular rate and rhythm, no respiratory distress, no tachypnea, lungs clear to auscultation bilaterally, no abdominal tenderness with patient, no epigastric TTP. Oral w/ e/o oropharyngeal candidiasis.   Lab workup demonstrates glucose 492 mg/dL, negative group A strep, negative lipase, negative troponin, sodium 134, potassium 4.9, renal function okay, UA with 80 ketones and greater than 500 glucose with high spec graph, no leukocytosis or anemia.  In fact he is volume contraction with hemoglobin 17.6 likely related to dehydration.  Will give normal saline bolus.  CO2 is 20 and anion gap is 11.  Total bilirubin is 1.7 the patient does not have any right upper quadrant tenderness to palpation.   New onset diabetes mellitus and oropharyngeal candidiasis. He has ketones in his urine but no anion gap, consider hyperglycemia vs HHS, but no e/o DKA. Will admit to hospitalist w/ plans for insulin SQ, IVF, and diabetes education.    Loetta Rough, MD 02/08/23 [phone]

## 2023-02-08 NOTE — Progress Notes (Signed)
Pharmacy Antibiotic Note  Austin Cunningham is a 67 y.o. male admitted on 02/08/2023 with  oropharyngeal candidiasis . Pharmacy has been consulted for fluconazole dosing.  Plan: - Give fluconazole 200mg  PO x1 today, followed by fluconazole 100mg  PO daily x7 days.    Height: 5' 7.5" (171.5 cm) Weight: 84.1 kg (185 lb 6.5 oz) IBW/kg (Calculated) : 67.25  Temp (24hrs), Avg:98.2 F (36.8 C), Min:97.7 F (36.5 C), Max:98.4 F (36.9 C)  Recent Labs  Lab 02/08/23 1609 02/08/23 1717  WBC 9.9  --   CREATININE  --  1.05    Estimated Creatinine Clearance: 69.5 mL/min (by C-G formula based on SCr of 1.05 mg/dL).    No Known Allergies  Antimicrobials this admission: 8/22 fluconazole >>   Dose adjustments this admission: N/A  Microbiology results: N/A    Thank you for allowing pharmacy to be a part of this patient's care.  Cherylin Mylar, PharmD Clinical Pharmacist  8/22/20247:12 PM

## 2023-02-08 NOTE — ED Notes (Signed)
Ems at bedside  

## 2023-02-08 NOTE — ED Provider Notes (Signed)
[place] EMERGENCY DEPARTMENT AT [facility] Provider Note   CSN: [id number] Arrival date & time: 02/08/23  1403     History  Chief Complaint  Patient presents with   Hyperglycemia    Austin Cunningham is a 67 y.o. male history of hypertension, hyperlipidemia, migraines presented with sore throat and hyperglycemia.  Patient states he has had a sore throat since last night and went to urgent care this morning where they found his blood sugar to be over 600.  Patient denies any previous Struve diabetes or glucose issues however notes that he did have a bowl of ice cream before going to urgent care.  Patient was then referred to ED.  Patient states that with his sore throat he has right-sided chest pain that does not radiate and is unsure what causes his chest pain or makes it better or worse.  Chest pain does not radiate and is not pleuritic in nature.  Patient also endorses a bitter taste in his mouth.  Patient denies shortness of breath, hemoptysis, fevers, neck stiffness, headache, vision changes, generalized weakness  Last A1c 02/27/2022: 6.1  Home Medications Prior to Admission medications   Medication Sig Start Date End Date Taking? Authorizing Provider  losartan (COZAAR) 50 MG tablet Take 50 mg by mouth daily.   Yes [provider]  simvastatin (ZOCOR) 20 MG tablet Take 20 mg by mouth daily.   Yes [provider]  atorvastatin (LIPITOR) 10 MG tablet Take 1 tablet (10 mg total) by mouth daily. Patient not taking: Reported on 02/08/2023 11/14/11 11/13/12  Jonita Albee, MD  azithromycin [facility]) 250 MG tablet Sig as indicated Patient not taking: Reported on 02/08/2023 12/05/17   Georgina Quint, MD  predniSONE (DELTASONE) 20 MG tablet Take 3 PO QAM x3days, 2 PO QAM x3days, 1 PO QAM x3days Patient not taking: Reported on 12/05/2017 07/12/15   Dorna Leitz, PA-C  promethazine-codeine (PHENERGAN WITH CODEINE) 6.25-10 MG/5ML syrup Take 5 mLs by mouth at  bedtime as needed for cough. Patient not taking: Reported on 02/08/2023 12/05/17   Georgina Quint, MD  verapamil (CALAN) 40 MG tablet Take 1 tablet (40 mg total) by mouth 2 (two) times daily before lunch and supper. Patient not taking: Reported on 02/08/2023 11/14/11 11/13/12  Jonita Albee, MD      Allergies    Patient has no known allergies.    Review of Systems   Review of Systems  Physical Exam Updated Vital Signs BP (!) 131/90   Pulse 88   Temp 98.4 F (36.9 C) (Oral)   Resp 18   Ht 5' 7.5" (1.715 m)   Wt 84.1 kg   SpO2 96%   BMI 28.61 kg/m  Physical Exam Vitals reviewed.  Constitutional:      General: He is not in acute distress. HENT:     Head: Normocephalic and atraumatic.     Right Ear: Tympanic membrane, ear canal and external ear normal.     Left Ear: Tympanic membrane, ear canal and external ear normal.     Mouth/Throat:     Mouth: Mucous membranes are moist.     Comments: Thrush noted Eyes:     Extraocular Movements: Extraocular movements intact.     Conjunctiva/sclera: Conjunctivae normal.     Pupils: Pupils are equal, round, and reactive to light.  Cardiovascular:     Rate and Rhythm: Normal rate and regular rhythm.     Pulses: Normal pulses.  Heart sounds: Normal heart sounds. No murmur heard.    Comments: 2+ bilateral radial/dorsalis pedis pulses with regular rate Pulmonary:     Effort: Pulmonary effort is normal. No respiratory distress.     Breath sounds: Normal breath sounds.  Abdominal:     Palpations: Abdomen is soft.     Tenderness: There is no abdominal tenderness. There is no guarding or rebound.  Musculoskeletal:        General: Normal range of motion.     Cervical back: Normal range of motion and neck supple.     Comments: 5 out of 5 bilateral grip/leg extension strength  Skin:    General: Skin is warm and dry.     Capillary Refill: Capillary refill takes less than 2 seconds.     Findings: No rash.  Neurological:     General:  No focal deficit present.     Mental Status: He is alert and oriented to person, place, and time.     Comments: Sensation intact in all 4 limbs  Psychiatric:        Mood and Affect: Mood normal.     ED Results / Procedures / Treatments   Labs (all labs ordered are listed, but only abnormal results are displayed) Labs Reviewed  CBC WITH DIFFERENTIAL/PLATELET - Abnormal; Notable for the following components:      Result Value   RBC 6.28 (*)    Hemoglobin 17.6 (*)    HCT 53.3 (*)    Platelets 131 (*)    All other components within normal limits  URINALYSIS, ROUTINE W REFLEX MICROSCOPIC - Abnormal; Notable for the following components:   Color, Urine STRAW (*)    Specific Gravity, Urine 1.033 (*)    Glucose, UA >=500 (*)    Ketones, ur 80 (*)    All other components within normal limits  COMPREHENSIVE METABOLIC PANEL - Abnormal; Notable for the following components:   Sodium 134 (*)    CO2 20 (*)    Glucose, Bld 355 (*)    Calcium 8.1 (*)    Total Protein 5.9 (*)    Albumin 3.3 (*)    Total Bilirubin 1.7 (*)    All other components within normal limits  BLOOD GAS, VENOUS - Abnormal; Notable for the following components:   pCO2, Ven 35 (*)    pO2, Ven 76 (*)    Acid-base deficit 3.8 (*)    All other components within normal limits  CBG MONITORING, ED - Abnormal; Notable for the following components:   Glucose-Capillary 492 (*)    All other components within normal limits  GROUP A STREP BY PCR  SARS CORONAVIRUS 2 BY RT PCR  LIPASE, BLOOD  BETA-HYDROXYBUTYRIC ACID  HIV ANTIBODY (ROUTINE TESTING W REFLEX)  HEMOGLOBIN A1C  BASIC METABOLIC PANEL  CBC  TROPONIN I (HIGH SENSITIVITY)  TROPONIN I (HIGH SENSITIVITY)    EKG EKG Interpretation Date/Time:  Thursday February 08 2023 14:41:30 EDT Ventricular Rate:  90 PR Interval:  169 QRS Duration:  99 QT Interval:  362 QTC Calculation: 443 R Axis:   -63  Text Interpretation: Sinus rhythm Inferior infarct, old ST elevation  in anterior leads without reciprocal depressions Confirmed by Vivi Barrack [phone]) on 02/08/2023 3:30:59 PM  Radiology DG Chest Port 1 View  Result Date: 02/08/2023 CLINICAL DATA:  Right chest pain EXAM: PORTABLE CHEST 1 VIEW COMPARISON:  Chest x-ray 11/06/2011 FINDINGS: The heart size and mediastinal contours are within normal limits. Both lungs are clear. The visualized  skeletal structures are unremarkable. IMPRESSION: No active disease. Electronically Signed   By: Darliss Cheney M.D.   On: 02/08/2023 18:34    Procedures Procedures    Medications Ordered in ED Medications  fluconazole (DIFLUCAN) tablet 200 mg (has no administration in time range)  losartan (COZAAR) tablet 50 mg (has no administration in time range)  simvastatin (ZOCOR) tablet 20 mg (has no administration in time range)  enoxaparin (LOVENOX) injection 40 mg (has no administration in time range)  insulin aspart (novoLOG) injection 0-15 Units (has no administration in time range)  insulin aspart (novoLOG) injection 0-5 Units (has no administration in time range)  0.9 %  sodium chloride infusion (has no administration in time range)  acetaminophen (TYLENOL) tablet 650 mg (has no administration in time range)    Or  acetaminophen (TYLENOL) suppository 650 mg (has no administration in time range)  traZODone (DESYREL) tablet 25 mg (has no administration in time range)  ondansetron (ZOFRAN) tablet 4 mg (has no administration in time range)    Or  ondansetron (ZOFRAN) injection 4 mg (has no administration in time range)  albuterol (PROVENTIL) (2.5 MG/3ML) 0.083% nebulizer solution 2.5 mg (has no administration in time range)  clotrimazole (MYCELEX) troche 10 mg (has no administration in time range)  sodium chloride 0.9 % bolus 1,000 mL (0 mLs Intravenous Stopped 02/08/23 1902)    ED Course/ Medical Decision Making/ A&P                                 Medical Decision Making Amount and/or Complexity of Data  Reviewed Labs: ordered. Radiology: ordered.   Joram A Cleckler 67 y.o. presented today for sore throat, hyperglycemia, chest pain. Working DDx that I considered at this time includes, but not limited to, New Onset Diabetes, thrush, strep pharyngitis, mono, hyperglycemic state secondary to recent food intake, DKA/HHS, dehydration, AKI, electrolyte abnormalities, anemia, ACS, aortic dissection, endocarditis, rheumatic fever, scarlet fever.  R/o DDx: strep pharyngitis, mono, hyperglycemic state secondary to recent food intake, DKA/HHS, dehydration, AKI, electrolyte abnormalities, anemia, ACS, aortic dissection, endocarditis, rheumatic fever, scarlet fever.: These are considered less likely due to history of present illness and physical exam findings  Review of prior external notes: 02/08/2023 urgent care  Unique Tests and My Interpretation:  Group A strep: Negative Troponin: 8 CBC: Unremarkable CMP: Glucose 355; total bili 1.7 however patient is not having right upper quadrant pain and does not appear jaundiced Lipase: Negative COVID: Negative UA: Glucose and ketones noted CBG: 492 CXR: No acute changes EKG: Sinus 90 bpm, no reciprocal changes with minimal ST elevation, no blocks noted  Discussion with Independent Historian:  Daughter  Discussion of Management of Tests:  Erenest Blank, MD Hospitalist  Risk: Medium: prescription drug management  Risk Stratification Score: None  Staffed with Jearld Fenton, MD  Plan: On exam patient was in no acute distress stable vitals.  Patient's exam does show he has thrush however the rest of the exam is unremarkable.  Patient's CBG here was 492 however at the urgent care was reported over 600.  I suspect patient's hyperglycemic state at the urgent care is secondary to recently having a bowl of ice cream  however will give fluids as his sugar is still elevated at 492 and we would expect sugar to be lower after eating ice cream.  Will get labs along with tropes as patient  was endorsing chest pain as well.  Strep swab ordered along  with COVID.  Patient stable at this time.  The attending and I spoken we agree patient most likely has new onset diabetes given his elevated sugar and will need admission for this.  This would explain why patient has rash on his exam and will treat.  Patient does not have anion gap indicative of DKA but does have ketones in his urine along with elevated sugars.  I spoke to the hospitalist and hospitalist accepted patient for admission.  Diflucan was started per pharmacist.  Patient stable for admission.         Final Clinical Impression(s) / ED Diagnoses Final diagnoses:  Hyperglycemia  Thrush    Rx / DC Orders ED Discharge Orders     None         Remi Deter 02/08/23 1906    Loetta Rough, MD 02/08/23 2022

## 2023-02-08 NOTE — ED Triage Notes (Signed)
Here with Daughter (who requested to interpret).  "He said he has a bitter mouth taste and his balance is off". "He constantly has to use the bathroom and pee's often with sometimes inability to hold it".   Also, "his left knee hurts causing unsteadiness with shaking". No injury known.   Additional questions "No chest pain". Some nausea at times. Vomiting episodes "last week".

## 2023-02-08 NOTE — ED Notes (Signed)
Lab called to add on troponin 

## 2023-02-08 NOTE — ED Notes (Signed)
Patient is being discharged from the Urgent Care and sent to the Emergency Department via EMS (Called at 1313). Per Provider Guy Sandifer), patient is in need of higher level of care due to High Glucose (> 600). Patient is aware and verbalizes understanding of plan of care.  Vitals:   02/08/23 1252 02/08/23 1255  BP: (!) 127/92 (!) 122/90  Pulse: 98 94  Resp: 18 18  Temp: 97.7 F (36.5 C)   SpO2: 95% 95%

## 2023-02-08 NOTE — ED Triage Notes (Signed)
Patient BIB EMS from UC. Patient went to [facility] for sore throat and bitter taste in mouth. UC found BGL was >600. On arrival BGL was 492. Patient c/o throat and right sided chest pain. No hx of DM.

## 2023-02-08 NOTE — ED Provider Notes (Signed)
[facility]    CSN: [id number] Arrival date & time: 02/08/23  1222      History   Chief Complaint Chief Complaint  Patient presents with   Knee Pain   Dizziness    HPI Austin Cunningham is a 67 y.o. male.   Patient here today for multiple concerns.  His daughter helps with translation at their request.  Upon initial evaluation glucose elevated significantly.  The history is provided by the patient.  Knee Pain Associated symptoms: no fever   Dizziness Associated symptoms: nausea and vomiting   Associated symptoms: no shortness of breath     Past Medical History:  Diagnosis Date   Hyperlipidemia    Migraine headache with aura     Patient Active Problem List   Diagnosis Date Noted   Cough 12/05/2017   Lower resp. tract infection 12/05/2017   Chest congestion 12/05/2017   NONSPECIFIC ABNORMAL FINDING IN STOOL CONTENTS 04/26/2009    History reviewed. No pertinent surgical history.     Home Medications    Prior to Admission medications   Medication Sig Start Date End Date Taking? Authorizing Provider  atorvastatin (LIPITOR) 10 MG tablet Take 1 tablet (10 mg total) by mouth daily. Patient not taking: Reported on 02/08/2023 11/14/11 11/13/12  Jonita Albee, MD  azithromycin [facility]) 250 MG tablet Sig as indicated Patient not taking: Reported on 02/08/2023 12/05/17   Georgina Quint, MD  losartan (COZAAR) 50 MG tablet Take 50 mg by mouth daily.    [provider]  predniSONE (DELTASONE) 20 MG tablet Take 3 PO QAM x3days, 2 PO QAM x3days, 1 PO QAM x3days Patient not taking: Reported on 12/05/2017 07/12/15   Dorna Leitz, PA-C  promethazine-codeine (PHENERGAN WITH CODEINE) 6.25-10 MG/5ML syrup Take 5 mLs by mouth at bedtime as needed for cough. Patient not taking: Reported on 02/08/2023 12/05/17   Georgina Quint, MD  simvastatin (ZOCOR) 20 MG tablet Take 20 mg by mouth daily.    [provider]  verapamil (CALAN) 40 MG tablet Take 1  tablet (40 mg total) by mouth 2 (two) times daily before lunch and supper. Patient not taking: Reported on 02/08/2023 11/14/11 11/13/12  Jonita Albee, MD    Family History History reviewed. No pertinent family history.  Social History Social History   Tobacco Use   Smoking status: Former    Current packs/day: 0.00    Average packs/day: 1 pack/day for 40.0 years (40.0 ttl pk-yrs)    Types: Cigarettes    Start date: 11/13/1968    Quit date: 11/13/2008    Years since quitting: 14.2   Smokeless tobacco: Never  Vaping Use   Vaping status: Never Used  Substance Use Topics   Alcohol use: Not Currently   Drug use: Never     Allergies   Patient has no known allergies.   Review of Systems Review of Systems  Constitutional:  Negative for chills and fever.  Eyes:  Negative for discharge and redness.  Respiratory:  Negative for shortness of breath.   Gastrointestinal:  Positive for nausea and vomiting.  Musculoskeletal:  Positive for arthralgias.  Neurological:  Positive for dizziness. Negative for numbness.     Physical Exam Triage Vital Signs ED Triage Vitals  Encounter Vitals Group     BP 02/08/23 1252 (!) 127/92     Systolic BP Percentile --      Diastolic BP Percentile --      Pulse Rate 02/08/23 1252 98  Resp 02/08/23 1252 18     Temp 02/08/23 1252 97.7 F (36.5 C)     Temp Source 02/08/23 1252 Oral     SpO2 02/08/23 1252 95 %     Weight 02/08/23 1251 185 lb 6.5 oz (84.1 kg)     Height 02/08/23 1251 5' 7.5" (1.715 m)     Head Circumference --      Peak Flow --      Pain Score 02/08/23 1246 4     Pain Loc --      Pain Education --      Exclude from Growth Chart --    No data found.  Updated Vital Signs BP (!) 122/90 (BP Location: Right Arm)   Pulse 94   Temp 97.7 F (36.5 C) (Oral)   Resp 18   Ht 5' 7.5" (1.715 m)   Wt 185 lb 6.5 oz (84.1 kg)   SpO2 95%   BMI 28.61 kg/m   Physical Exam Vitals and nursing note reviewed.  Constitutional:       Comments: Appears frail  HENT:     Head: Normocephalic and atraumatic.  Eyes:     Conjunctiva/sclera: Conjunctivae normal.  Cardiovascular:     Rate and Rhythm: Normal rate.  Pulmonary:     Effort: Pulmonary effort is normal. No respiratory distress.  Neurological:     Mental Status: He is alert.  Psychiatric:        Mood and Affect: Mood normal.        Behavior: Behavior normal.        Thought Content: Thought content normal.      UC Treatments / Results  Labs (all labs ordered are listed, but only abnormal results are displayed) Labs Reviewed  POCT URINALYSIS DIP (MANUAL ENTRY) - Abnormal; Notable for the following components:      Result Value   Glucose, UA =500 (*)    Ketones, POC UA moderate (40) (*)    Blood, UA trace-intact (*)    All other components within normal limits  POCT FASTING CBG KUC MANUAL ENTRY    EKG   Radiology No results found.  Procedures Procedures (including critical care time)  Medications Ordered in UC Medications - No data to display  Initial Impression / Assessment and Plan / UC Course  I have reviewed the triage vital signs and the nursing notes.  Pertinent labs & imaging results that were available during my care of the patient were reviewed by me and considered in my medical decision making (see chart for details).      Glucose extremely elevated in office- recommend further evaluation in the ED for stat labs, etc. Patient transport by EMS after discussion with family.    Final Clinical Impressions(s) / UC Diagnoses   Final diagnoses:  Hyperglycemia   Discharge Instructions   None    ED Prescriptions   None    PDMP not reviewed this encounter.   Tomi Bamberger, PA-C 02/08/23 [phone]

## 2023-02-09 ENCOUNTER — Other Ambulatory Visit (HOSPITAL_COMMUNITY): Payer: Self-pay

## 2023-02-09 DIAGNOSIS — B379 Candidiasis, unspecified: Secondary | ICD-10-CM | POA: Diagnosis not present

## 2023-02-09 LAB — CBC
HCT: 49.4 % (ref 39.0–52.0)
Hemoglobin: 16 g/dL (ref 13.0–17.0)
MCH: 27.6 pg (ref 26.0–34.0)
MCHC: 32.4 g/dL (ref 30.0–36.0)
MCV: 85.2 fL (ref 80.0–100.0)
Platelets: 113 10*3/uL — ABNORMAL LOW (ref 150–400)
RBC: 5.8 MIL/uL (ref 4.22–5.81)
RDW: 12.9 % (ref 11.5–15.5)
WBC: 7.3 10*3/uL (ref 4.0–10.5)
nRBC: 0 % (ref 0.0–0.2)

## 2023-02-09 LAB — BASIC METABOLIC PANEL
Anion gap: 10 (ref 5–15)
BUN: 17 mg/dL (ref 8–23)
CO2: 22 mmol/L (ref 22–32)
Calcium: 8.1 mg/dL — ABNORMAL LOW (ref 8.9–10.3)
Chloride: 103 mmol/L (ref 98–111)
Creatinine, Ser: 0.94 mg/dL (ref 0.61–1.24)
GFR, Estimated: >60 mL/min (ref >60)
Glucose, Bld: 234 mg/dL — ABNORMAL HIGH (ref 70–99)
Potassium: 3.6 mmol/L (ref 3.5–5.1)
Sodium: 135 mmol/L (ref 135–145)

## 2023-02-09 LAB — GLUCOSE, CAPILLARY
Glucose-Capillary: 250 mg/dL — ABNORMAL HIGH (ref 70–99)
Glucose-Capillary: 223 mg/dL — ABNORMAL HIGH (ref 70–99)
Glucose-Capillary: 255 mg/dL — ABNORMAL HIGH (ref 70–99)
Glucose-Capillary: 216 mg/dL — ABNORMAL HIGH (ref 70–99)
Glucose-Capillary: 474 mg/dL — ABNORMAL HIGH (ref 70–99)
Glucose-Capillary: 367 mg/dL — ABNORMAL HIGH (ref 70–99)

## 2023-02-09 LAB — HEMOGLOBIN A1C
Hgb A1c MFr Bld: 13 % — ABNORMAL HIGH (ref 4.8–5.6)
Mean Plasma Glucose: 326.4 mg/dL

## 2023-02-09 LAB — HIV ANTIBODY (ROUTINE TESTING W REFLEX): HIV Screen 4th Generation wRfx: NONREACTIVE

## 2023-02-09 MED ORDER — LIVING WELL WITH DIABETES BOOK
Freq: Once | Status: AC
  Filled 2023-02-09: qty 1

## 2023-02-09 MED ORDER — INSULIN GLARGINE-YFGN 100 UNIT/ML ~~LOC~~ SOLN
10.0000 [IU] | Freq: Every day | SUBCUTANEOUS | Status: DC
  Administered 2023-02-09: 10 [IU] via SUBCUTANEOUS
  Filled 2023-02-09 (×2): qty 0.1

## 2023-02-09 MED ORDER — PNEUMOCOCCAL 20-VAL CONJ VACC 0.5 ML IM SUSY
0.5000 mL | PREFILLED_SYRINGE | INTRAMUSCULAR | Status: AC
  Administered 2023-02-10: 0.5 mL via INTRAMUSCULAR
  Filled 2023-02-09: qty 0.5

## 2023-02-09 NOTE — Progress Notes (Signed)
PHARMACY NOTE -  fluconazole  Pharmacy has been assisting with dosing of fluconazole for oropharyngeal candidiasis .  Dosage remains stable at 100mg  daily x 7 days and need for further dosage adjustment appears unlikely at present.    Will sign off at this time.  Please reconsult if a change in clinical status warrants re-evaluation of dosage.    Dorna Leitz, PharmD, BCPS 02/09/2023 10:19 AM

## 2023-02-09 NOTE — Progress Notes (Signed)
Nutrition Note  RD consulted for nutrition education regarding diabetes.   Lab Results  Component Value Date   HGBA1C 13.0 (H) 02/09/2023   Pt in room, his daughter is present to interpret for him. He speaks some english but primary language is vietnamese. Provided handout in vietnamese per pt's daughter request.   RD provided "Carbohydrate Counting for People with Diabetes" handout in [name] from the Academy of Nutrition and Dietetics. Discussed different food groups and their effects on blood sugar, emphasizing carbohydrate-containing foods. Provided list of carbohydrates and recommended serving sizes of common foods.  Discussed importance of controlled and consistent carbohydrate intake throughout the day. Provided examples of ways to balance meals/snacks and encouraged intake of high-fiber, whole grain complex carbohydrates. Teach back method used.  Expect fair compliance. Pt's daughter present for education, will provide support for patient.  Body mass index is 28.61 kg/m. Pt meets criteria for overweight based on current BMI.  Current diet order is CHO modified , patient is consuming approximately 75% of meals at this time. Labs and medications reviewed. No further nutrition interventions warranted at this time. If additional nutrition issues arise, please re-consult RD.  Tilda Franco, MS, RD, LDN Inpatient Clinical Dietitian Contact information available via Amion

## 2023-02-09 NOTE — TOC Benefit Eligibility Note (Signed)
Patient Product/process development scientist completed.    The patient is insured through [employer]. Patient has Medicare and is not eligible for a copay card, but may be able to apply for patient assistance, if available.    Ran test claim for Lantus Pen and the current 30 day co-pay is $4.60.  Ran test claim for Novolog FlexPen and the current 30 day co-pay is $4.60.   This test claim was processed through [facility]- copay amounts may vary at other pharmacies due to pharmacy/plan contracts, or as the patient moves through the different stages of their insurance plan.     Roland Earl, CPHT Pharmacy Technician III Certified Patient Advocate [facility] Pharmacy Patient Advocate Team Direct Number: [phone]  Fax: [phone]

## 2023-02-09 NOTE — Progress Notes (Signed)
PROGRESS NOTE    Austin Cunningham  ZOX:[id number] DOB: 15-Jan-1956 DOA: 02/08/2023 PCP: Patient, No Pcp Per   Brief Narrative:  This 67 years old male with PMH significant for hypertension, hyperlipidemia presented in the ED with complaints of pain in the back of throat and associated hyperglycemia.  Patient reports for last 1 week he has sore throat that bothers him most in the morning.  He also reports having polydipsia and polyuria for last couple of weeks.  He went to urgent care and was found to have blood sugar in the 600s and was brought in the ED.  Initial blood glucose in the ED was 495 and was given IV fluid resuscitation.  He was also found to have oropharyngeal candidiasis on exam.  Patient is admitted for new onset diabetes complicated by oropharyngeal candidiasis.  Patient is started on fluconazole and admitted for further evaluation.  Assessment & Plan:   Principal Problem:   Candidiasis  New onset diabetes: Suspected due to significant hyperglycemia without evidence of ketoacidosis.   Patient does have ketones in the urine, metabolic acidosis but no anion gap.   Blood sugar is improving with IV hydration. Continue IV fluid resuscitation.  Carb modified diet.   Hemoglobin A1c 13 consistent with poor control. Continue regular insulin sliding scale, start Semglee 10 units and increase as blood sugar Diabetic coordinator consult.  Oropharyngeal candidiasis: Continue Diflucan p.o. daily.  Pseudohyponatremia due to hyperglycemia. Serum sodium improved as blood sugar improved.  Polycythemia: Likely due to clinical dehydration.  Expect to improve with IV hydration.  Hypertension Continue losartan.  Hyperlipidemia: Continue Zocor.  DVT prophylaxis: Lovenox Code Status: Full code Family Communication: No family at bed side. Disposition Plan:     Status is: Observation The patient remains OBS appropriate and will d/c before 2 midnights.   Admitted for new onset diabetes with  no DKA and oropharyngeal candidiasis started on fluconazole. Consultants:  None  Procedures: None  Antimicrobials:  Anti-infectives (From admission, onward)    Start     Dose/Rate Route Frequency Ordered Stop   02/09/23 2000  fluconazole (DIFLUCAN) tablet 100 mg        100 mg Oral Daily 02/08/23 1916 02/16/23 0959   02/08/23 1930  fluconazole (DIFLUCAN) tablet 200 mg        200 mg Oral  Once 02/08/23 1859 02/08/23 2258      Subjective: Patient was seen and examined at bedside.  Overnight events noted.   Patient reports feeling tired but denies any pain.  He still reports having pain in the throat which is improving medications.  Objective: Vitals:   02/08/23 2108 02/09/23 0050 02/09/23 0548 02/09/23 0938  BP: (!) 137/91 122/84 96/72 104/76  Pulse: 91 82 74 80  Resp: 18 16 20 17   Temp: 97.8 F (36.6 C) 97.7 F (36.5 C) 98.3 F (36.8 C) 97.8 F (36.6 C)  TempSrc: Oral Oral  Oral  SpO2: 97% 96% 96% 97%  Weight:      Height:        Intake/Output Summary (Last 24 hours) at 02/09/2023 1131 Last data filed at 02/09/2023 0940 Gross per 24 hour  Intake 1996.71 ml  Output --  Net 1996.71 ml   Filed Weights   02/08/23 1420  Weight: 84.1 kg    Examination:  General exam: Appears calm and comfortable, not in any acute distress. Respiratory system: Clear to auscultation. Respiratory effort normal.  RR 15 Cardiovascular system: S1 & S2 heard, RRR. No JVD, murmurs,  rubs, gallops or clicks. No pedal edema. Gastrointestinal system: Abdomen is non distended, soft and non tender. Normal bowel sounds heard. Central nervous system: Alert and oriented X3. No focal neurological deficits. Extremities: Symmetric 5 x 5 power. Skin: No rashes, lesions or ulcers Psychiatry: Judgement and insight appear normal. Mood & affect appropriate.     Data Reviewed: I have personally reviewed following labs and imaging studies  CBC: Recent Labs  Lab 02/08/23 1609 02/09/23 0507  WBC 9.9  7.3  NEUTROABS 5.8  --   HGB 17.6* 16.0  HCT 53.3* 49.4  MCV 84.9 85.2  PLT 131* 113*   Basic Metabolic Panel: Recent Labs  Lab 02/08/23 1717 02/09/23 0507  NA 134* 135  K 4.9 3.6  CL 103 103  CO2 20* 22  GLUCOSE 355* 234*  BUN 19 17  CREATININE 1.05 0.94  CALCIUM 8.1* 8.1*   GFR: Estimated Creatinine Clearance: 77.6 mL/min (by C-G formula based on SCr of 0.94 mg/dL). Liver Function Tests: Recent Labs  Lab 02/08/23 1717  AST 22  ALT 17  ALKPHOS 64  BILITOT 1.7*  PROT 5.9*  ALBUMIN 3.3*   Recent Labs  Lab 02/08/23 1717  LIPASE 38   No results for input(s): "AMMONIA" in the last 168 hours. Coagulation Profile: No results for input(s): "INR", "PROTIME" in the last 168 hours. Cardiac Enzymes: No results for input(s): "CKTOTAL", "CKMB", "CKMBINDEX", "TROPONINI" in the last 168 hours. BNP (last 3 results) No results for input(s): "PROBNP" in the last 8760 hours. HbA1C: Recent Labs    02/09/23 0507  HGBA1C 13.0*   CBG: Recent Labs  Lab 02/08/23 1436 02/08/23 2130 02/09/23 0721  GLUCAP 492* 308* 250*   Lipid Profile: No results for input(s): "CHOL", "HDL", "LDLCALC", "TRIG", "CHOLHDL", "LDLDIRECT" in the last 72 hours. Thyroid Function Tests: No results for input(s): "TSH", "T4TOTAL", "FREET4", "T3FREE", "THYROIDAB" in the last 72 hours. Anemia Panel: No results for input(s): "VITAMINB12", "FOLATE", "FERRITIN", "TIBC", "IRON", "RETICCTPCT" in the last 72 hours. Sepsis Labs: No results for input(s): "PROCALCITON", "LATICACIDVEN" in the last 168 hours.  Recent Results (from the past 240 hour(s))  Group A Strep by PCR     Status: None   Collection Time: 02/08/23  4:13 PM   Specimen: Throat; Sterile Swab  Result Value Ref Range Status   Group A Strep by PCR NOT DETECTED NOT DETECTED Final    Comment: Performed at [facility], [address] [place], [place] [address]  SARS Coronavirus 2 by RT PCR (hospital order, performed in [facility] hospital lab) *cepheid single result test* Anterior Nasal Swab     Status: None   Collection Time: 02/08/23  5:08 PM   Specimen: Anterior Nasal Swab  Result Value Ref Range Status   SARS Coronavirus 2 by RT PCR NEGATIVE NEGATIVE Final    Comment: (NOTE) SARS-CoV-2 target nucleic acids are NOT DETECTED.  The SARS-CoV-2 RNA is generally detectable in upper and lower respiratory specimens during the acute phase of infection. The lowest concentration of SARS-CoV-2 viral copies this assay can detect is 250 copies / mL. A negative result does not preclude SARS-CoV-2 infection and should not be used as the sole basis for treatment or other patient management decisions.  A negative result may occur with improper specimen collection / handling, submission of specimen other than nasopharyngeal swab, presence of viral mutation(s) within the areas targeted by this assay, and inadequate number of viral copies (<250 copies / mL). A negative result must be  combined with clinical observations, patient history, and epidemiological information.  Fact Sheet for Patients:   RoadLapTop.co.za  Fact Sheet for Healthcare Providers: http://kim-miller.com/  This test is not yet approved or  cleared by the [place] FDA and has been authorized for detection and/or diagnosis of SARS-CoV-2 by FDA under an Emergency Use Authorization (EUA).  This EUA will remain in effect (meaning this test can be used) for the duration of the COVID-19 declaration under Section 564(b)(1) of the Act, 21 U.S.C. section 360bbb-3(b)(1), unless the authorization is terminated or revoked sooner.  Performed at [facility], [address] [place], [place] [address]     Radiology Studies: [facility] Chest Port 1 View  Result Date: 02/08/2023 CLINICAL DATA:  Right chest pain EXAM: PORTABLE CHEST 1 VIEW COMPARISON:  Chest x-ray 11/06/2011 FINDINGS: The heart size  and mediastinal contours are within normal limits. Both lungs are clear. The visualized skeletal structures are unremarkable. IMPRESSION: No active disease. Electronically Signed   By: Darliss Cheney M.D.   On: 02/08/2023 18:34     Scheduled Meds:  enoxaparin (LOVENOX) injection  40 mg Subcutaneous Q24H   fluconazole  100 mg Oral Daily   insulin aspart  0-15 Units Subcutaneous TID WC   insulin aspart  0-5 Units Subcutaneous QHS   insulin glargine-yfgn  10 Units Subcutaneous Daily   losartan  50 mg Oral Daily   [START ON 02/10/2023] pneumococcal 20-valent conjugate vaccine  0.5 mL Intramuscular Tomorrow-1000   simvastatin  20 mg Oral Daily   Continuous Infusions:  sodium chloride 75 mL/hr at 02/08/23 2258     LOS: 0 days    Time spent: 50 mins    Willeen Niece, MD [facility]   If 7PM-7AM, please contact night-coverage

## 2023-02-09 NOTE — Plan of Care (Signed)

## 2023-02-09 NOTE — Plan of Care (Signed)

## 2023-02-09 NOTE — Inpatient Diabetes Management (Addendum)
Inpatient Diabetes Program Recommendations  AACE/ADA: New Consensus Statement on Inpatient Glycemic Control (2015)  Target Ranges:  Prepandial:   less than 140 mg/dL      Peak postprandial:   less than 180 mg/dL (1-2 hours)      Critically ill patients:  140 - 180 mg/dL    Latest Reference Range & Units 02/08/23 18:23  Beta-Hydroxybutyric Acid 0.05 - 0.27 mmol/L 4.28 (H)  (H): Data is abnormally high  Latest Reference Range & Units 02/09/23 05:07  Hemoglobin A1C 4.8 - 5.6 % 13.0 (H)  326 mg/dl  (H): Data is abnormally high  Latest Reference Range & Units 02/08/23 14:36 02/08/23 21:30 02/09/23 07:21  Glucose-Capillary 70 - 99 mg/dL 865 (H) 784 (H)  4 units Novolog  250 (H)  (H): Data is abnormally high   Admit with: Oropharyngeal candidiasis/ Hyperglycemia/ New onset diabetes    Current Orders: Novolog Moderate Correction Scale/ SSI (0-15 units) TID AC + HS      Semglee 10 units Daily    Spoke with pt's Dtr about new diagnosis.  Dtr requested I speak with her and provide the education as pt does not speak fluent Frances Furbish Dtr is an [place] Charity fundraiser and she and sister will be proving care to pt at time of d/c.    Discussed A1C results with Dtr and explained what an A1C is, basic pathophysiology of DM Type 2, basic home care, basic diabetes diet nutrition principles, importance of checking CBGs and maintaining good CBG control to prevent long-term and short-term complications.  Reviewed signs and symptoms of hyperglycemia and hypoglycemia and how to treat hypoglycemia at home.  Also reviewed blood sugar goals and A1c goals for home.    RNs to provide ongoing basic DM education at bedside with this patient.  Have ordered educational booklet, insulin starter kit.  Have also placed RD consult for DM diet education for this patient.  Have also attached several diabetes educational videos to pt's AVS (QR codes)--Dtr stated she knows how to scan QR code to watch the videos.    Educated pt's Dtr  on insulin pen use at home.  Reviewed all steps of insulin pen including attachment of needle, 2-unit air shot, dialing up dose, giving injection, rotation of injection sites, removing needle, disposal of sharps, storage of unused insulin, disposal of insulin etc.  Dtr able to provide successful return demonstration.  Reviewed troubleshooting with insulin pen. Have asked RNs caring for patient to please allow patient to give all injections here in hospital as much as possible for practice.  MD to give patient Rxs for insulin pens and insulin pen needles.    Also discussed DM diet information with patient's Dtr.  Encouraged patient to avoid beverages with sugar (regular soda, sweet tea, lemonade, fruit juice) and to consume mostly water.  Discussed what foods contain carbohydrates and how carbohydrates affect the body's blood sugar levels.  Encouraged patient's Dtr to be careful with his portion sizes (especially grains, starchy vegetables, and fruits).    Dtr interested in using FSL CGM for pt at home.  Reviewed Freestyle Libre 3 CGM system (how to use, how to place new sensor, sensor life, troubleshooting, warm-up time, how to use app on phone, rotation of insertion sites, etc).  Did not place sensor yet as pt remains inpatient.  Dtr instructed to replace sensor in 14 days.  Encouraged Dtr to monitor glucose levels pre and post meals.  Reviewed healthy CBG goals for home.  Dtr asked to seek  refills for the sensors from pt's  PCP.  Dtr educated to check fingerstick CBG with traditional CBG meter when glucose reading does not match how pt feels.  Info packet given to Dtr and Attached an educational Youtube video to AVS on how to download app and attach 1st sensor--Dtr aware and Appreciative.     --Will follow patient during hospitalization--  Ambrose Finland RN, MSN, CDCES Diabetes Coordinator Inpatient Glycemic Control Team Team Pager: [phone] (8a-5p)

## 2023-02-09 NOTE — Progress Notes (Signed)
   02/09/23 1336  TOC Brief Assessment  Insurance and Status Reviewed  Patient has primary care physician No (Pt/daughter to call to schedule appointment)  Home environment has been reviewed Home  Prior level of function: Independent  Prior/Current Home Services No current home services  Social Determinants of Health Reivew SDOH reviewed no interventions necessary  Readmission risk has been reviewed Yes  Transition of care needs no transition of care needs at this time

## 2023-02-09 NOTE — Discharge Instructions (Signed)
How to Download the [employer] 3 App and Apply the Sensor [email or web address]

## 2023-02-09 NOTE — TOC Benefit Eligibility Note (Signed)
Pharmacy Patient Advocate Encounter  Insurance verification completed.    The patient is insured through  [employer] . Patient has Medicare and is not eligible for a copay card, but may be able to apply for patient assistance, if available.    Ran test claim for Dexcom G7 and the current 30 day co-pay is 74.90.  Ran test claim for [employer] 3 and the current 30 day co-pay is 40.13. This test claim was processed through [facility]- copay amounts may vary at other pharmacies due to pharmacy/plan contracts, or as the patient moves through the different stages of their insurance plan.

## 2023-02-10 DIAGNOSIS — Z7984 Long term (current) use of oral hypoglycemic drugs: Secondary | ICD-10-CM | POA: Diagnosis not present

## 2023-02-10 DIAGNOSIS — D751 Secondary polycythemia: Secondary | ICD-10-CM | POA: Diagnosis present

## 2023-02-10 DIAGNOSIS — E860 Dehydration: Secondary | ICD-10-CM | POA: Diagnosis present

## 2023-02-10 DIAGNOSIS — B379 Candidiasis, unspecified: Secondary | ICD-10-CM | POA: Diagnosis not present

## 2023-02-10 DIAGNOSIS — Z87891 Personal history of nicotine dependence: Secondary | ICD-10-CM | POA: Diagnosis not present

## 2023-02-10 DIAGNOSIS — E785 Hyperlipidemia, unspecified: Secondary | ICD-10-CM | POA: Diagnosis present

## 2023-02-10 DIAGNOSIS — B370 Candidal stomatitis: Secondary | ICD-10-CM | POA: Diagnosis present

## 2023-02-10 DIAGNOSIS — Z23 Encounter for immunization: Secondary | ICD-10-CM | POA: Diagnosis not present

## 2023-02-10 DIAGNOSIS — B3789 Other sites of candidiasis: Secondary | ICD-10-CM | POA: Diagnosis present

## 2023-02-10 DIAGNOSIS — E1165 Type 2 diabetes mellitus with hyperglycemia: Secondary | ICD-10-CM | POA: Diagnosis present

## 2023-02-10 DIAGNOSIS — I10 Essential (primary) hypertension: Secondary | ICD-10-CM | POA: Diagnosis present

## 2023-02-10 DIAGNOSIS — Z1152 Encounter for screening for COVID-19: Secondary | ICD-10-CM | POA: Diagnosis not present

## 2023-02-10 DIAGNOSIS — Z79899 Other long term (current) drug therapy: Secondary | ICD-10-CM | POA: Diagnosis not present

## 2023-02-10 LAB — GLUCOSE, CAPILLARY
Glucose-Capillary: 218 mg/dL — ABNORMAL HIGH (ref 70–99)
Glucose-Capillary: 241 mg/dL — ABNORMAL HIGH (ref 70–99)
Glucose-Capillary: 267 mg/dL — ABNORMAL HIGH (ref 70–99)
Glucose-Capillary: 149 mg/dL — ABNORMAL HIGH (ref 70–99)

## 2023-02-10 LAB — CBC
HCT: 50.2 % (ref 39.0–52.0)
Hemoglobin: 16.1 g/dL (ref 13.0–17.0)
MCH: 27.6 pg (ref 26.0–34.0)
MCHC: 32.1 g/dL (ref 30.0–36.0)
MCV: 86.1 fL (ref 80.0–100.0)
Platelets: 106 10*3/uL — ABNORMAL LOW (ref 150–400)
RBC: 5.83 MIL/uL — ABNORMAL HIGH (ref 4.22–5.81)
RDW: 13 % (ref 11.5–15.5)
WBC: 6.5 10*3/uL (ref 4.0–10.5)
nRBC: 0 % (ref 0.0–0.2)

## 2023-02-10 LAB — COMPREHENSIVE METABOLIC PANEL
ALT: 20 U/L (ref 0–44)
AST: 16 U/L (ref 15–41)
Albumin: 3 g/dL — ABNORMAL LOW (ref 3.5–5.0)
Alkaline Phosphatase: 55 U/L (ref 38–126)
Anion gap: 7 (ref 5–15)
BUN: 14 mg/dL (ref 8–23)
CO2: 23 mmol/L (ref 22–32)
Calcium: 8.2 mg/dL — ABNORMAL LOW (ref 8.9–10.3)
Chloride: 106 mmol/L (ref 98–111)
Creatinine, Ser: 0.77 mg/dL (ref 0.61–1.24)
GFR, Estimated: >60 mL/min (ref >60)
Glucose, Bld: 213 mg/dL — ABNORMAL HIGH (ref 70–99)
Potassium: 3.6 mmol/L (ref 3.5–5.1)
Sodium: 136 mmol/L (ref 135–145)
Total Bilirubin: 0.8 mg/dL (ref 0.3–1.2)
Total Protein: 5.6 g/dL — ABNORMAL LOW (ref 6.5–8.1)

## 2023-02-10 LAB — MAGNESIUM: Magnesium: 1.9 mg/dL (ref 1.7–2.4)

## 2023-02-10 LAB — PHOSPHORUS: Phosphorus: 2.2 mg/dL — ABNORMAL LOW (ref 2.5–4.6)

## 2023-02-10 MED ORDER — POTASSIUM PHOSPHATES 15 MMOLE/5ML IV SOLN
30.0000 mmol | Freq: Once | INTRAVENOUS | Status: AC
  Administered 2023-02-10: 30 mmol via INTRAVENOUS
  Filled 2023-02-10: qty 10

## 2023-02-10 MED ORDER — INSULIN GLARGINE-YFGN 100 UNIT/ML ~~LOC~~ SOLN
12.0000 [IU] | Freq: Every day | SUBCUTANEOUS | Status: DC
  Administered 2023-02-10 – 2023-02-11 (×2): 12 [IU] via SUBCUTANEOUS
  Filled 2023-02-10 (×2): qty 0.12

## 2023-02-10 NOTE — Progress Notes (Signed)
PROGRESS NOTE    Austin Cunningham  RUE:[id number] DOB: 10/08/1955 DOA: 02/08/2023 PCP: Patient, No Pcp Per   Brief Narrative:  This 67 years old male with PMH significant for hypertension, hyperlipidemia presented in the ED with complaints of pain in the back of throat and associated hyperglycemia.  Patient reports for last 1 week he has sore throat that bothers him most in the morning.  He also reports having polydipsia and polyuria for last couple of weeks.  He went to urgent care and was found to have blood sugar in the 600s and was brought in the ED.  Initial blood glucose in the ED was 495 and was given IV fluid resuscitation.  He was also found to have oropharyngeal candidiasis on exam.  Patient is admitted for new onset diabetes complicated by oropharyngeal candidiasis.  Patient is started on fluconazole and admitted for further evaluation.  Assessment & Plan:   Principal Problem:   Candidiasis  New onset diabetes: Suspected due to significant hyperglycemia without evidence of ketoacidosis.   Patient does have ketones in the urine, metabolic acidosis but no anion gap.   Blood sugar is improving with IV hydration. Continue IV fluid resuscitation.  Carb modified diet.   Hemoglobin A1c 13 consistent with poor control. Continue regular insulin sliding scale, increase Semglee 12 units and increase as blood sugar. Diabetic coordinator consult.  Oropharyngeal candidiasis: Continue Diflucan p.o. daily.  Pseudohyponatremia due to hyperglycemia. Serum sodium improved as blood sugar improved.  Polycythemia: Likely due to clinical dehydration.  Expect to improve with IV hydration.  Hypertension Continue losartan.  Hyperlipidemia: Continue Zocor.  DVT prophylaxis: Lovenox Code Status: Full code Family Communication: No family at bed side. Disposition Plan:     Status is: Observation The patient remains OBS appropriate and will d/c before 2 midnights.   Admitted for new onset diabetes  with no DKA and oropharyngeal candidiasis started on fluconazole.  Consultants:  None  Procedures: None  Antimicrobials:  Anti-infectives (From admission, onward)    Start     Dose/Rate Route Frequency Ordered Stop   02/09/23 2000  fluconazole (DIFLUCAN) tablet 100 mg        100 mg Oral Daily 02/08/23 1916 02/16/23 0959   02/08/23 1930  fluconazole (DIFLUCAN) tablet 200 mg        200 mg Oral  Once 02/08/23 1859 02/08/23 2258      Subjective: Patient was seen and examined at bedside.  Overnight events noted.   Patient reports feeling tired but denies any pain. He still reports having pain in the throat which is improving with medications.  Objective: Vitals:   02/09/23 0938 02/09/23 1310 02/09/23 1939 02/10/23 0503  BP: 104/76 115/78 129/74 131/88  Pulse: 80 77 86 68  Resp: 17 16 18 18   Temp: 97.8 F (36.6 C) (!) 97.5 F (36.4 C) 97.8 F (36.6 C) 97.7 F (36.5 C)  TempSrc: Oral Oral Oral Oral  SpO2: 97% 98% 96% 99%  Weight:      Height:        Intake/Output Summary (Last 24 hours) at 02/10/2023 1118 Last data filed at 02/09/2023 1542 Gross per 24 hour  Intake 1111.56 ml  Output --  Net 1111.56 ml   Filed Weights   02/08/23 1420  Weight: 84.1 kg    Examination:  General exam: Appears calm and comfortable, not in any acute distress. Respiratory system: CTA bilaterally.  Respiratory effort normal.  RR 14 Cardiovascular system: S1 & S2 heard, RRR. No JVD,  murmurs, rubs, gallops or clicks. No pedal edema. Gastrointestinal system: Abdomen is non distended, soft and non tender. Normal bowel sounds heard. Central nervous system: Alert and oriented X3. No focal neurological deficits. Extremities: Symmetric 5 x 5 power. Skin: No rashes, lesions or ulcers Psychiatry: Judgement and insight appear normal. Mood & affect appropriate.     Data Reviewed: I have personally reviewed following labs and imaging studies  CBC: Recent Labs  Lab 02/08/23 1609 02/09/23 0507  02/10/23 0814  WBC 9.9 7.3 6.5  NEUTROABS 5.8  --   --   HGB 17.6* 16.0 16.1  HCT 53.3* 49.4 50.2  MCV 84.9 85.2 86.1  PLT 131* 113* 106*   Basic Metabolic Panel: Recent Labs  Lab 02/08/23 1717 02/09/23 0507 02/10/23 0814  NA 134* 135 136  K 4.9 3.6 3.6  CL 103 103 106  CO2 20* 22 23  GLUCOSE 355* 234* 213*  BUN 19 17 14   CREATININE 1.05 0.94 0.77  CALCIUM 8.1* 8.1* 8.2*  MG  --   --  1.9  PHOS  --   --  2.2*   GFR: Estimated Creatinine Clearance: 91.2 mL/min (by C-G formula based on SCr of 0.77 mg/dL). Liver Function Tests: Recent Labs  Lab 02/08/23 1717 02/10/23 0814  AST 22 16  ALT 17 20  ALKPHOS 64 55  BILITOT 1.7* 0.8  PROT 5.9* 5.6*  ALBUMIN 3.3* 3.0*   Recent Labs  Lab 02/08/23 1717  LIPASE 38   No results for input(s): "AMMONIA" in the last 168 hours. Coagulation Profile: No results for input(s): "INR", "PROTIME" in the last 168 hours. Cardiac Enzymes: No results for input(s): "CKTOTAL", "CKMB", "CKMBINDEX", "TROPONINI" in the last 168 hours. BNP (last 3 results) No results for input(s): "PROBNP" in the last 8760 hours. HbA1C: Recent Labs    02/09/23 0507  HGBA1C 13.0*   CBG: Recent Labs  Lab 02/09/23 1554 02/09/23 1735 02/09/23 1944 02/09/23 2158 02/10/23 0804  GLUCAP 255* 216* 474* 367* 218*   Lipid Profile: No results for input(s): "CHOL", "HDL", "LDLCALC", "TRIG", "CHOLHDL", "LDLDIRECT" in the last 72 hours. Thyroid Function Tests: No results for input(s): "TSH", "T4TOTAL", "FREET4", "T3FREE", "THYROIDAB" in the last 72 hours. Anemia Panel: No results for input(s): "VITAMINB12", "FOLATE", "FERRITIN", "TIBC", "IRON", "RETICCTPCT" in the last 72 hours. Sepsis Labs: No results for input(s): "PROCALCITON", "LATICACIDVEN" in the last 168 hours.  Recent Results (from the past 240 hour(s))  Group A Strep by PCR     Status: None   Collection Time: 02/08/23  4:13 PM   Specimen: Throat; Sterile Swab  Result Value Ref Range Status    Group A Strep by PCR NOT DETECTED NOT DETECTED Final    Comment: Performed at [facility], [place], [address], [place], [place] [address]  SARS Coronavirus 2 by RT PCR (hospital order, performed in [facility] hospital lab) *cepheid single result test* Anterior Nasal Swab     Status: None   Collection Time: 02/08/23  5:08 PM   Specimen: Anterior Nasal Swab  Result Value Ref Range Status   SARS Coronavirus 2 by RT PCR NEGATIVE NEGATIVE Final    Comment: (NOTE) SARS-CoV-2 target nucleic acids are NOT DETECTED.  The SARS-CoV-2 RNA is generally detectable in upper and lower respiratory specimens during the acute phase of infection. The lowest concentration of SARS-CoV-2 viral copies this assay can detect is 250 copies / mL. A negative result does not preclude SARS-CoV-2 infection and should not be used as the sole  basis for treatment or other patient management decisions.  A negative result may occur with improper specimen collection / handling, submission of specimen other than nasopharyngeal swab, presence of viral mutation(s) within the areas targeted by this assay, and inadequate number of viral copies (<250 copies / mL). A negative result must be combined with clinical observations, patient history, and epidemiological information.  Fact Sheet for Patients:   RoadLapTop.co.za  Fact Sheet for Healthcare Providers: http://kim-miller.com/  This test is not yet approved or  cleared by the [place] FDA and has been authorized for detection and/or diagnosis of SARS-CoV-2 by FDA under an Emergency Use Authorization (EUA).  This EUA will remain in effect (meaning this test can be used) for the duration of the COVID-19 declaration under Section 564(b)(1) of the Act, 21 U.S.C. section 360bbb-3(b)(1), unless the authorization is terminated or revoked sooner.  Performed at [facility], [address], [place], [place] [address]     Radiology Studies: [facility] Chest Port 1 View  Result Date: 02/08/2023 CLINICAL DATA:  Right chest pain EXAM: PORTABLE CHEST 1 VIEW COMPARISON:  Chest x-ray 11/06/2011 FINDINGS: The heart size and mediastinal contours are within normal limits. Both lungs are clear. The visualized skeletal structures are unremarkable. IMPRESSION: No active disease. Electronically Signed   By: Darliss Cheney M.D.   On: 02/08/2023 18:34     Scheduled Meds:  enoxaparin (LOVENOX) injection  40 mg Subcutaneous Q24H   fluconazole  100 mg Oral Daily   insulin aspart  0-15 Units Subcutaneous TID WC   insulin aspart  0-5 Units Subcutaneous QHS   insulin glargine-yfgn  12 Units Subcutaneous Daily   losartan  50 mg Oral Daily   simvastatin  20 mg Oral Daily   Continuous Infusions:  sodium chloride 75 mL/hr at 02/10/23 0117   potassium PHOSPHATE IVPB (in mmol)       LOS: 0 days    Time spent: 35 mins    Willeen Niece, MD [facility]   If 7PM-7AM, please contact night-coverage

## 2023-02-10 NOTE — Plan of Care (Signed)
  Problem: Education: Goal: Knowledge of General Education information will improve Description: Including pain rating scale, medication(s)/side effects and non-pharmacologic comfort measures Outcome: Progressing   Problem: Activity: Goal: Risk for activity intolerance will decrease Outcome: Progressing   

## 2023-02-10 NOTE — Plan of Care (Signed)
  Problem: Education: Goal: Knowledge of General Education information will improve Description Including pain rating scale, medication(s)/side effects and non-pharmacologic comfort measures Outcome: Progressing   Problem: Health Behavior/Discharge Planning: Goal: Ability to manage health-related needs will improve Outcome: Progressing   Problem: Clinical Measurements: Goal: Ability to maintain clinical measurements within normal limits will improve Outcome: Progressing Goal: Will remain free from infection Outcome: Progressing   Problem: Activity: Goal: Risk for activity intolerance will decrease Outcome: Progressing   Problem: Nutrition: Goal: Adequate nutrition will be maintained Outcome: Progressing   Problem: Elimination: Goal: Will not experience complications related to urinary retention Outcome: Progressing   Problem: Safety: Goal: Ability to remain free from injury will improve Outcome: Progressing   Problem: Skin Integrity: Goal: Risk for impaired skin integrity will decrease Outcome: Progressing

## 2023-02-11 DIAGNOSIS — B379 Candidiasis, unspecified: Secondary | ICD-10-CM | POA: Diagnosis not present

## 2023-02-11 LAB — GLUCOSE, CAPILLARY
Glucose-Capillary: 189 mg/dL — ABNORMAL HIGH (ref 70–99)
Glucose-Capillary: 212 mg/dL — ABNORMAL HIGH (ref 70–99)
Glucose-Capillary: 81 mg/dL (ref 70–99)
Glucose-Capillary: 221 mg/dL — ABNORMAL HIGH (ref 70–99)

## 2023-02-11 MED ORDER — INSULIN GLARGINE-YFGN 100 UNIT/ML ~~LOC~~ SOLN
3.0000 [IU] | SUBCUTANEOUS | Status: AC
  Administered 2023-02-11: 3 [IU] via SUBCUTANEOUS
  Filled 2023-02-11: qty 0.03

## 2023-02-11 MED ORDER — POTASSIUM PHOSPHATES 15 MMOLE/5ML IV SOLN
15.0000 mmol | Freq: Once | INTRAVENOUS | Status: AC
  Administered 2023-02-11: 15 mmol via INTRAVENOUS
  Filled 2023-02-11: qty 5

## 2023-02-11 MED ORDER — INSULIN GLARGINE-YFGN 100 UNIT/ML ~~LOC~~ SOLN
15.0000 [IU] | Freq: Every day | SUBCUTANEOUS | Status: DC
  Administered 2023-02-12: 15 [IU] via SUBCUTANEOUS
  Filled 2023-02-11: qty 0.15

## 2023-02-11 MED ORDER — METFORMIN HCL 500 MG PO TABS
500.0000 mg | ORAL_TABLET | Freq: Every day | ORAL | Status: DC
  Administered 2023-02-11 – 2023-02-12 (×2): 500 mg via ORAL
  Filled 2023-02-11 (×2): qty 1

## 2023-02-11 NOTE — Progress Notes (Addendum)
PROGRESS NOTE    Austin Cunningham  WUJ:[id number]  DOB: 1955-07-03  DOA: 02/08/2023 PCP: Patient, No Pcp Per Outpatient Specialists:   Hospital course:  67 year old [place] man was admitted with new onset diabetes without DKA.  He was treated with insulin and fluid resuscitation with improved control of sugar.  Hemoglobin A1c was 13, daughter notes that it was 6 last year.   Subjective:  Met with patient and entire family.  Daughter is fluent in [place] and notes that she will be providing his care.  They have met with the diabetes educator and have some understanding of diet.  Also they have administered insulin in the past so feels comfortable with him going home on insulin but hopes that he can be switched off to other medications in the future.  He has follow-up with a new PCP on Friday.  Objective: Vitals:   02/10/23 1245 02/10/23 2204 02/11/23 0616 02/11/23 1149  BP: (!) 171/110 (!) 135/99 128/86 (!) 123/92  Pulse: 95 81 78 81  Resp: 19 18 18 17   Temp: 97.9 F (36.6 C) 97.7 F (36.5 C) 97.8 F (36.6 C) 97.7 F (36.5 C)  TempSrc: Oral Oral Oral Oral  SpO2: 98% 100% 98% 98%  Weight:      Height:        Intake/Output Summary (Last 24 hours) at 02/11/2023 1334 Last data filed at 02/11/2023 0910 Gross per 24 hour  Intake 875.98 ml  Output --  Net 875.98 ml   Filed Weights   02/08/23 1420  Weight: 84.1 kg     Exam:  General: Well-appearing man lying flat in bed with attentive family gathered around Eyes: sclera anicteric, conjuctiva mild injection bilaterally CVS: S1-S2, regular  Respiratory:  decreased air entry bilaterally secondary to decreased inspiratory effort, rales at bases  GI: NABS, soft, NT  LE: Warm and well-perfused Neuro: A/O x 3,  grossly nonfocal.    Data Reviewed:  Basic Metabolic Panel: Recent Labs  Lab 02/08/23 1717 02/09/23 0507 02/10/23 0814  NA 134* 135 136  K 4.9 3.6 3.6  CL 103 103 106  CO2 20* 22 23  GLUCOSE 355*  234* 213*  BUN 19 17 14   CREATININE 1.05 0.94 0.77  CALCIUM 8.1* 8.1* 8.2*  MG  --   --  1.9  PHOS  --   --  2.2*    CBC: Recent Labs  Lab 02/08/23 1609 02/09/23 0507 02/10/23 0814  WBC 9.9 7.3 6.5  NEUTROABS 5.8  --   --   HGB 17.6* 16.0 16.1  HCT 53.3* 49.4 50.2  MCV 84.9 85.2 86.1  PLT 131* 113* 106*     Scheduled Meds:  enoxaparin (LOVENOX) injection  40 mg Subcutaneous Q24H   fluconazole  100 mg Oral Daily   insulin aspart  0-15 Units Subcutaneous TID WC   insulin aspart  0-5 Units Subcutaneous QHS   [START ON 02/12/2023] insulin glargine-yfgn  15 Units Subcutaneous Daily   insulin glargine-yfgn  3 Units Subcutaneous NOW   losartan  50 mg Oral Daily   simvastatin  20 mg Oral Daily   Continuous Infusions:   Assessment & Plan:   New onset diabetes Hemoglobin A1c was 13 Blood sugars in the mid 200s since glargine increased to 12 units His aspart requirement has decreased from 20 to 18 yesterday, so far he is gotten 8 units Will add 3 units to the 12 units of glargine he received a couple of hours ago for a  total of 15 units today. Will add metformin 500 mg daily Will DC maintenance fluids as patient is eating well If blood sugars under better control, will discharge home tomorrow follow-up with new PCP in 5 days already scheduled  Thrush Continue Diflucan  Hypophosphatemia Replete and recheck in the morning  HTN Blood pressure at goal on losartan  Dyslipidemia Continue simvastatin     DVT prophylaxis: Lovenox Code Status: Full code Family Communication: Daughter and entire family was at bedside      Studies: No results found.  Principal Problem:   Candidiasis     Pieter Partridge, [facility]  If 7PM-7AM, please contact night-coverage [email or web address]   LOS: 1 day

## 2023-02-11 NOTE — Plan of Care (Signed)
No acute events overnight. Pt anticipates going home. Problem: Education: Goal: Knowledge of General Education information will improve Description: Including pain rating scale, medication(s)/side effects and non-pharmacologic comfort measures Outcome: Progressing   Problem: Health Behavior/Discharge Planning: Goal: Ability to manage health-related needs will improve Outcome: Progressing   Problem: Clinical Measurements: Goal: Ability to maintain clinical measurements within normal limits will improve Outcome: Progressing Goal: Will remain free from infection Outcome: Progressing Goal: Diagnostic test results will improve Outcome: Progressing Goal: Respiratory complications will improve Outcome: Progressing Goal: Cardiovascular complication will be avoided Outcome: Progressing   Problem: Activity: Goal: Risk for activity intolerance will decrease Outcome: Progressing   Problem: Nutrition: Goal: Adequate nutrition will be maintained Outcome: Progressing   Problem: Coping: Goal: Level of anxiety will decrease Outcome: Progressing   Problem: Elimination: Goal: Will not experience complications related to bowel motility Outcome: Progressing Goal: Will not experience complications related to urinary retention Outcome: Progressing   Problem: Pain Managment: Goal: General experience of comfort will improve Outcome: Progressing   Problem: Safety: Goal: Ability to remain free from injury will improve Outcome: Progressing   Problem: Skin Integrity: Goal: Risk for impaired skin integrity will decrease Outcome: Progressing

## 2023-02-11 NOTE — Plan of Care (Signed)

## 2023-02-11 NOTE — Progress Notes (Signed)
Mobility Specialist - Progress Note   02/11/23 1440  Mobility  Activity Ambulated with assistance in hallway  Level of Assistance Standby assist, set-up cues, supervision of patient - no hands on  Assistive Device None  Distance Ambulated (ft) 350 ft  Range of Motion/Exercises Active  Activity Response Tolerated well  Mobility Referral Yes  $Mobility charge 1 Mobility  Mobility Specialist Start Time (ACUTE ONLY) 1425  Mobility Specialist Stop Time (ACUTE ONLY) 1438  Mobility Specialist Time Calculation (min) (ACUTE ONLY) 13 min   Pt received in bed and agreed to mobility. Had no issues throughout session, returned to bed with all needs met.  Marilynne Halsted Mobility Specialist

## 2023-02-12 DIAGNOSIS — B379 Candidiasis, unspecified: Secondary | ICD-10-CM | POA: Diagnosis not present

## 2023-02-12 LAB — BASIC METABOLIC PANEL
Anion gap: 7 (ref 5–15)
BUN: 12 mg/dL (ref 8–23)
CO2: 22 mmol/L (ref 22–32)
Calcium: 8.8 mg/dL — ABNORMAL LOW (ref 8.9–10.3)
Chloride: 105 mmol/L (ref 98–111)
Creatinine, Ser: 0.89 mg/dL (ref 0.61–1.24)
GFR, Estimated: >60 mL/min (ref >60)
Glucose, Bld: 196 mg/dL — ABNORMAL HIGH (ref 70–99)
Potassium: 3.7 mmol/L (ref 3.5–5.1)
Sodium: 134 mmol/L — ABNORMAL LOW (ref 135–145)

## 2023-02-12 LAB — GLUCOSE, CAPILLARY: Glucose-Capillary: 150 mg/dL — ABNORMAL HIGH (ref 70–99)

## 2023-02-12 LAB — PHOSPHORUS: Phosphorus: 2.8 mg/dL (ref 2.5–4.6)

## 2023-02-12 MED ORDER — LANCETS MISC: 1.0000 | Freq: Three times a day (TID) | 0 refills | Status: AC

## 2023-02-12 MED ORDER — METFORMIN HCL 500 MG PO TABS: 500.0000 mg | ORAL_TABLET | Freq: Two times a day (BID) | ORAL | 11 refills | Status: AC

## 2023-02-12 MED ORDER — FLUCONAZOLE 100 MG PO TABS: 100.0000 mg | ORAL_TABLET | Freq: Every day | ORAL | 0 refills | Status: AC

## 2023-02-12 MED ORDER — BLOOD GLUCOSE TEST VI STRP: 1.0000 | ORAL_STRIP | Freq: Three times a day (TID) | 0 refills | Status: AC

## 2023-02-12 MED ORDER — LANCET DEVICE MISC: 1.0000 | Freq: Three times a day (TID) | 0 refills | Status: AC

## 2023-02-12 MED ORDER — INSULIN DEGLUDEC 100 UNIT/ML ~~LOC~~ SOPN: 20.0000 [IU] | PEN_INJECTOR | Freq: Every day | SUBCUTANEOUS | 0 refills | Status: AC

## 2023-02-12 MED ORDER — BLOOD GLUCOSE MONITORING SUPPL DEVI: 1.0000 | Freq: Three times a day (TID) | 0 refills | Status: AC

## 2023-02-12 MED ORDER — PEN NEEDLES 31G X 5 MM MISC: 1.0000 | Freq: Three times a day (TID) | 0 refills | Status: AC

## 2023-02-12 NOTE — Discharge Summary (Signed)
Physician Discharge Summary  Austin Cunningham WUJ:[id number] DOB: 1955/09/06 DOA: 02/08/2023  PCP: To be updated by TOC  Admit date: 02/08/2023 Discharge date: 02/12/2023  Time spent: 40 minutes  Recommendations for Outpatient Follow-up:  New start to insulin this hospitalization needs close PCP follow-up Needs Chem-7, CBC in about 2 weeks Needs outpatient retinopathy/nephropathy screening  Discharge Diagnoses:  MAIN problem for hospitalization   New onset diabetes mellitus Oral candidiasis Mild hyperbilirubinemia constitutional  Please see below for itemized issues addressed in HOpsital- refer to other progress notes for clarity if needed  Discharge Condition: Improved  Diet recommendation: Diabetic  Filed Weights   02/08/23 1420  Weight: 84.1 kg    History of present illness:  67 year old [name] male known HTN HLD Sent over from urgent care on 8/22 with very high sugar in the 600 range Also had 1 week of sore throat polyuria polydipsia additionally brought to the emergency room and initial blood sugar was 492 patient was admitted for hyperosmolar nonketotic state  Hospital Course:  New onset diabetes mellitus with A1c of 13 Patient had mild metabolic acidosis no anion gap so HHS Patient was adjusted on various regimens of insulins and ultimately on discharge it was decided to place him on long-acting Tresiba 20 units and twice daily metformin 500 Diabetes coordinator will coordinate freestyle libre as well as outpatient follow-up and teaching Patient understands he will need to take 1 injection a day and 2 pills 1 in the morning 1 in the evening of metformin-I have discussed this with his daughter additionally  Oropharyngeal candidiasis Complete Diflucan  Thrombocytopenia-probably needs outpatient workup Can be deferred  HTN HLD    Discharge Exam: Vitals:   02/11/23 1947 02/12/23 0535  BP: 120/86 110/78  Pulse: 94 69  Resp: 16 16  Temp: 99.1 F (37.3 C) 97.6 F  (36.4 C)  SpO2: 99% 100%    Subj on day of d/c   Awake coherent pleasant I discussed the case with the patient's in person with his daughter on the phone serving as translator at her preference  General Exam on discharge  EOMI NCAT neck soft supple no discomfort Some mild plaques on tongue Abdomen soft no rebound no guarding Chest is clear ROM is intact no focal deficits Power 5/5  Discharge Instructions   Discharge Instructions     Amb Referral to Nutrition and Diabetic Education   Complete by: As directed    Diet - low sodium heart healthy   Complete by: As directed    Discharge instructions   Complete by: As directed    Please use once daily Levemir 20 units Would also recommend using twice daily metformin and avoid greasy foods as this can cause diarrhea when you are on metformin-avoid alcohol when you are on this I would recommend additionally checking your sugars at least twice a day either pre or post prandially or as per diabetic coordinator input Use your freestyle libre as per instructions by diabetic coordinator and follow-up with your primary care physician Finish all of the meds for your thrush the fluconazole we will call this into your pharmacy   Increase activity slowly   Complete by: As directed       Allergies as of 02/12/2023   No Known Allergies      Medication List     STOP taking these medications    atorvastatin 10 MG tablet Commonly known as: LIPITOR   azithromycin 250 MG tablet Commonly known as: ZITHROMAX   predniSONE 20  MG tablet Commonly known as: DELTASONE   promethazine-codeine 6.25-10 MG/5ML syrup Commonly known as: PHENERGAN with CODEINE   verapamil 40 MG tablet Commonly known as: CALAN       TAKE these medications    Blood Glucose Monitoring Suppl Devi 1 each by Does not apply route 3 (three) times daily. May dispense any manufacturer covered by patient's insurance.   BLOOD GLUCOSE TEST STRIPS Strp 1 each by  Does not apply route 3 (three) times daily. Use as directed to check blood sugar. May dispense any manufacturer covered by patient's insurance and fits patient's device.   fluconazole 100 MG tablet Commonly known as: DIFLUCAN Take 1 tablet (100 mg total) by mouth daily for 3 days.   insulin degludec 100 UNIT/ML FlexTouch Pen Commonly known as: TRESIBA Inject 20 Units into the skin daily. May substitute as needed per insurance.   Lancet Device Misc 1 each by Does not apply route 3 (three) times daily. May dispense any manufacturer covered by patient's insurance.   Lancets Misc 1 each by Does not apply route 3 (three) times daily. Use as directed to check blood sugar. May dispense any manufacturer covered by patient's insurance and fits patient's device.   losartan 50 MG tablet Commonly known as: COZAAR Take 50 mg by mouth daily.   metFORMIN 500 MG tablet Commonly known as: GLUCOPHAGE Take 1 tablet (500 mg total) by mouth 2 (two) times daily with a meal.   Pen Needles 31G X 5 MM Misc 1 each by Does not apply route 3 (three) times daily. May dispense any manufacturer covered by patient's insurance.   simvastatin 20 MG tablet Commonly known as: ZOCOR Take 20 mg by mouth daily.       No Known Allergies    The results of significant diagnostics from this hospitalization (including imaging, microbiology, ancillary and laboratory) are listed below for reference.    Significant Diagnostic Studies: DG Chest Port 1 View  Result Date: 02/08/2023 CLINICAL DATA:  Right chest pain EXAM: PORTABLE CHEST 1 VIEW COMPARISON:  Chest x-ray 11/06/2011 FINDINGS: The heart size and mediastinal contours are within normal limits. Both lungs are clear. The visualized skeletal structures are unremarkable. IMPRESSION: No active disease. Electronically Signed   By: Darliss Cheney M.D.   On: 02/08/2023 18:34    Microbiology: Recent Results (from the past 240 hour(s))  Group A Strep by PCR     Status:  None   Collection Time: 02/08/23  4:13 PM   Specimen: Throat; Sterile Swab  Result Value Ref Range Status   Group A Strep by PCR NOT DETECTED NOT DETECTED Final    Comment: Performed at [facility], [address] [place], [place] [address]  SARS Coronavirus 2 by RT PCR (hospital order, performed in [facility] hospital lab) *cepheid single result test* Anterior Nasal Swab     Status: None   Collection Time: 02/08/23  5:08 PM   Specimen: Anterior Nasal Swab  Result Value Ref Range Status   SARS Coronavirus 2 by RT PCR NEGATIVE NEGATIVE Final    Comment: (NOTE) SARS-CoV-2 target nucleic acids are NOT DETECTED.  The SARS-CoV-2 RNA is generally detectable in upper and lower respiratory specimens during the acute phase of infection. The lowest concentration of SARS-CoV-2 viral copies this assay can detect is 250 copies / mL. A negative result does not preclude SARS-CoV-2 infection and should not be used as the sole basis for treatment or other patient management decisions.  A negative result  may occur with improper specimen collection / handling, submission of specimen other than nasopharyngeal swab, presence of viral mutation(s) within the areas targeted by this assay, and inadequate number of viral copies (<250 copies / mL). A negative result must be combined with clinical observations, patient history, and epidemiological information.  Fact Sheet for Patients:   RoadLapTop.co.za  Fact Sheet for Healthcare Providers: http://kim-miller.com/  This test is not yet approved or  cleared by the [place] FDA and has been authorized for detection and/or diagnosis of SARS-CoV-2 by FDA under an Emergency Use Authorization (EUA).  This EUA will remain in effect (meaning this test can be used) for the duration of the COVID-19 declaration under Section 564(b)(1) of the Act, 21 U.S.C. section 360bbb-3(b)(1), unless the  authorization is terminated or revoked sooner.  Performed at [facility], [address], [place], [place] [address]      Labs: Basic Metabolic Panel: Recent Labs  Lab 02/08/23 1717 02/09/23 0507 02/10/23 0814 02/12/23 0800  NA 134* 135 136 134*  K 4.9 3.6 3.6 3.7  CL 103 103 106 105  CO2 20* 22 23 22   GLUCOSE 355* 234* 213* 196*  BUN 19 17 14 12   CREATININE 1.05 0.94 0.77 0.89  CALCIUM 8.1* 8.1* 8.2* 8.8*  MG  --   --  1.9  --   PHOS  --   --  2.2* 2.8   Liver Function Tests: Recent Labs  Lab 02/08/23 1717 02/10/23 0814  AST 22 16  ALT 17 20  ALKPHOS 64 55  BILITOT 1.7* 0.8  PROT 5.9* 5.6*  ALBUMIN 3.3* 3.0*   Recent Labs  Lab 02/08/23 1717  LIPASE 38   No results for input(s): "AMMONIA" in the last 168 hours. CBC: Recent Labs  Lab 02/08/23 1609 02/09/23 0507 02/10/23 0814  WBC 9.9 7.3 6.5  NEUTROABS 5.8  --   --   HGB 17.6* 16.0 16.1  HCT 53.3* 49.4 50.2  MCV 84.9 85.2 86.1  PLT 131* 113* 106*   Cardiac Enzymes: No results for input(s): "CKTOTAL", "CKMB", "CKMBINDEX", "TROPONINI" in the last 168 hours. BNP: BNP (last 3 results) No results for input(s): "BNP" in the last 8760 hours.  ProBNP (last 3 results) No results for input(s): "PROBNP" in the last 8760 hours.  CBG: Recent Labs  Lab 02/11/23 0718 02/11/23 1147 02/11/23 1615 02/11/23 2112 02/12/23 0723  GLUCAP 189* 212* 81 221* 150*       Signed:  Rhetta Mura MD   [facility] 02/12/2023, 9:11 AM

## 2023-02-12 NOTE — TOC Progression Note (Signed)
Transition of Care [facility]) - Progression Note    Patient Details  Name: Austin Cunningham MRN: [id number] Date of Birth: 1955/09/14  [facility] CM/SW Contact  Otelia Santee, LCSW Phone Number: 02/12/2023, 9:55 AM  Clinical Narrative:    PCP information placed on pt's f/u.     Barriers to Discharge: No Barriers Identified  Expected Discharge Plan and Services         Expected Discharge Date: 02/12/23               DME Arranged: N/A DME Agency: NA                   Social Determinants of Health (SDOH) Interventions SDOH Screenings   Food Insecurity: No Food Insecurity (02/08/2023)  Housing: Low Risk  (02/08/2023)  Transportation Needs: No Transportation Needs (02/08/2023)  Utilities: Not At Risk (02/08/2023)  Social Connections: Unknown (10/26/2021)   Received from [facility]  Tobacco Use: Medium Risk (02/08/2023)    Readmission Risk Interventions     No data to display

## 2023-02-12 NOTE — Plan of Care (Signed)
